# Patient Record
Sex: Male | Born: 1940 | Race: White | Hispanic: Yes
Health system: Northeastern US, Academic
[De-identification: ages and names within clinical notes are randomized; demographics above are authoritative.]

---

## 2016-02-18 ENCOUNTER — Ambulatory Visit: Admitting: Urology

## 2016-02-18 NOTE — Progress Notes (Signed)
* * *        **  [name]**    --- ---    85 Y old Male, DOB: 1941/03/03    [address] [place], [place], [place] [address]    Home: [phone]    Provider: Eliezer Mccoy        * * *    Telephone Encounter    ---    Answered by   Virgil Benedict  Date: 02/18/2016         Time: 11:05 AM    Caller   Romilda Joy    --- ---            Reason   IOV - elevated psa - [facility] [place] pt - await notes and labs            Message                      elevated PSA - rising- Hx pf prostate cx - medical records will be sent to office - looking for soon appt.            [phone]                Action Taken   Codorniz,Vanessa 02/18/2016 11:05:57 AM > Leahy,Nancy 02/18/2016  12:16:02 PM > [facility] [place] pt from [facility] - await notes Leahy,Nancy 02/18/2016 3:53:50  PM > no notes yet Leahy,Nancy 02/19/2016 11:37:42 AM > please call with appt  and let them know no notes have come over - last [facility] note was from 2011 - need  everything faxed - notes, labs, biopsy reports and xrays Ward,Brenda 02/19/2016  1:29:32 PM > Spoke with Kenney Houseman at Dr. Earnestine Mealing office, they will fax over  all notes, labs, etc.                * * *                ---          * * *          [name], Carlos Moody DOB: [birth year] Provider: Eliezer Mccoy  02/18/2016    ---    Note generated by eClinicalWorks EMR/PM Software ([email or web address])

## 2016-02-20 ENCOUNTER — Ambulatory Visit

## 2016-02-26 ENCOUNTER — Ambulatory Visit: Admitting: Urology

## 2016-02-26 ENCOUNTER — Ambulatory Visit (HOSPITAL_BASED_OUTPATIENT_CLINIC_OR_DEPARTMENT_OTHER)

## 2016-02-26 ENCOUNTER — Ambulatory Visit

## 2016-02-26 NOTE — Progress Notes (Signed)
* * *        **Carlos Moody**    --- ---    51 Y old Male, DOB: 01/26/1941    Account Number: [id number]    [address], [place], [id number]    Home: [phone]    Guarantor: Carlos Moody Insurance: Medicare Payer ID: Evans Lance    PCP: Ricky Ala, MD Referring: Ricky Ala, MD    Appointment Facility: [facility]        * * *    02/26/2016  Progress Notes: Artist Pais. Adolfo Granieri, M.D.    --- ---    ---        Current Medications    ---    Taking     * Losartan Potassium 100 MG Tablet 1 tablet Orally Once a day    ---    * Chlorthalidone 25 MG Tablet 1 tablet in the morning Orally Once a day    ---    * Amlodipine Besylate 5 MG Tablet 1 tablet Orally Once a day    ---    * Simvastatin 10 MG Tablet 1 tablet in the evening Orally Once a day    ---    * Aspirin 81 MG Tablet Chewable 1 tablet Orally Once a day    ---    * Fish Oil 1000 MG Capsule 1 capsule Orally Once a day    ---    * Centrum - Tablet Orally     ---    * Vitamin B12 1000 MCG Tablet Extended Release 1 tablet Orally Once a day    ---    * Cinnamon 500 MG Capsule Orally     ---    * Medication List reviewed and reconciled with the patient    ---      Past Medical History    ---       HTN (hypertension).        ---    Hypercholesteremia.        ---      Surgical History    ---      RALP    ---    Hernia    ---      Family History    ---      Father: deceased    ---    Mother: deceased    ---      Social History    ---    Alcohol: yes.    no Smoking status: former smoker.    no Recreational drug use.    Caffeine: yes, frequency:, tea, 1-2 cups/day.      Allergies    ---      N.K.D.A.    ---      Hospitalization/Major Diagnostic Procedure    ---      No Hospitalization History.    ---      Review of Systems    ---     _General_ :    no Fatigue. no Fever. no Weakness.    _ENT_ :    no Epistaxis. no Ringing in ears. no Sinus pain.    _Endocrine_ :    no Excessive thirst. no dry mouth.    _Cardiovascular_ :    no Chest  pain. no Palpitations.    _Pulmonary_ :    no cough. no shortness of breath. no Lung disease (COPD), Asthma.    _Gastroenterology_ :    no Blood in stool. no Constipation. no Diarrhea.  _HematologyLymph_ :    no Bleeding disorders. no Easy bruising. no Swollen glands.    _Male Reproductive_ :    Sexually active yes. no Difficulty with erection. Diminished sexual drive yes.    _Musculoskeletal_ :    Back pain yes. no Joint pain. no bone pain. no Spinal or disk problems.    _Integumentary_ :    no Persistent itching. no Skin rash.    _Urology_ :    no Blood in urine. no Frequent urination. no Urinary incontinence.    _Neurology_ :    no Headache. no Insomnia. no Memory loss.    _Psychology_ :    no Anxiety. no Depression. no High stress level.            Reason for Appointment    ---      1\. Elevated PSA    ---      History of Present Illness    ---     _Prostate Cancer_ :    Carlos Moody is here due to his history of prostate cancer. He had RALP in 2010, and  his psa was zero, but recently his psa rose to 0.07 and then 0.1. He does  follow up for this surveillance at [facility] and [facility].    Sees Dr. Lenny Pastel for psa 0.07 then 0.1. He does void well, notes some urgency,  and a hot feeling if he has to void. He has nocturia once. Some small volume  leak with sneeze. He is already scheduled for psa follow up at [facility] in  November.      Vital Signs    ---    Ht 65, Wt 183.0, BMI 30.45, BP 130/78, Temp 97.7, RR 16.      Physical Examination    ---     _Abdomen_ :    Guarding: no. Hernia: absent. Inguinal nodes: not enlarged. Liver, Spleen: not  palpable. Masses: no. Scars: intact, well healed, no hernia. Shape: normal.  Tenderness: no.          Assessments    ---    1\. Prostate cancer - C61 (Primary)    ---      Treatment    ---       **1\. Prostate cancer**    Notes: Carlos Moody is followed at [facility] for surveillance after prostate cancer  surgery in 2010. His psa was undetectable but is now 0.07 and then 0.1. He is  due for his next  psa in November and already has follow up with Dr. Lenny Pastel. I  agree that this bears watching, and if the psa rises again at his next  interval (above 0.2) then he may warrant repeat imaging and potential consider  treatment such as salvage radiation or androgen deprivation, but his current  psa levels are very low, and a period of continued surveillance is certainly  warranted.    ---        **2\. Others**    Notes: Patient Educated with: [email or web address] ([email or web address]).        Labs    --- ---    Gerald Stabs: ClinitekStatus SG_    ---       Color  yellow     \-    --- --- --- ---    GLU  Negative     \-    --- --- --- ---    BIL  Negative     \-    --- --- --- ---    KET  Negative     \-    --- --- --- ---  SG  1.020     \-    --- --- --- ---    BLO  Trace-intact     \-    --- --- --- ---    pH  8.5     \-    --- --- --- ---    PRO  Negative     \-    --- --- --- ---    URO  0.2 E.U./dL     \-    --- --- --- ---    NIT  Negative     \-    --- --- --- ---    LEU  Negative     \-    --- --- --- ---         Silva,Melinda 02/26/2016 1:17:14 PM >    --- ---    Gerald Stabs: Urine Microscopy_       WBC  0       --- --- --- ---    RBC  0       --- --- --- ---    Epithelial  1-2       --- --- --- ---    Bacteria  0       --- --- --- ---    Amorphous  0       --- --- --- ---    Mucous  0       --- --- --- ---    Casts  0       --- --- --- ---    Other  0       --- --- --- ---         Silva,Melinda 02/26/2016 4:28:24 PM >    --- ---      Procedure Codes    ---      14782 UA Automated    ---    95621 MICROSCOPIC EXAM OF URINE    ---      Follow Up    ---    prn    Electronically signed by Lawerance Cruel , MD on 03/01/2016 at 07:46 AM EDT    Sign off status: Completed        * * [facility]    [address]    [place], [place] [id number]    Tel: [phone]    Fax: [phone]              * * *          [name], Carlos Moody DOB: 17-Oct-1940 Progress Note: Artist Pais. Noel Gerold,  M.D. 02/26/2016    ---    Note  generated by eClinicalWorks EMR/PM Software ([email or web address])

## 2016-07-03 LAB — BMP (EXT)
Anion Gap (EXT): 11.3 mmol/L (ref 6.0–18.0)
Anion Gap (EXT): 10.7 mmol/L (ref 6.0–18.0)
Anion Gap (EXT): 10.3 mmol/L (ref 6.0–18.0)
Anion Gap (EXT): 9.1 mmol/L (ref 6.0–18.0)
Anion Gap (EXT): 9.5 mmol/L (ref 6.0–18.0)
Anion Gap (EXT): 10.4 mmol/L (ref 6.0–18.0)
BUN (EXT): 12 mg/dL (ref 6–25)
BUN (EXT): 11 mg/dL (ref 6–25)
BUN (EXT): 10 mg/dL (ref 6–25)
BUN (EXT): 10 mg/dL (ref 6–25)
BUN (EXT): 10 mg/dL (ref 6–25)
BUN (EXT): 9 mg/dL (ref 6–25)
CO2 (EXT): 26 mmol/L (ref 21–32)
CO2 (EXT): 24 mmol/L (ref 21–32)
CO2 (EXT): 26 mmol/L (ref 21–32)
CO2 (EXT): 24 mmol/L (ref 21–32)
CO2 (EXT): 25 mmol/L (ref 21–32)
CO2 (EXT): 24 mmol/L (ref 21–32)
CalciumCalcium (EXT): 9.2 mg/dL (ref 8.0–10.5)
CalciumCalcium (EXT): 8.5 mg/dL (ref 8.0–10.5)
CalciumCalcium (EXT): 8.8 mg/dL (ref 8.0–10.5)
CalciumCalcium (EXT): 8.5 mg/dL (ref 8.0–10.5)
CalciumCalcium (EXT): 8.7 mg/dL (ref 8.0–10.5)
CalciumCalcium (EXT): 8.7 mg/dL (ref 8.0–10.5)
Chloride (EXT): 82 mmol/L — ABNORMAL LOW (ref 99–111)
Chloride (EXT): 85 mmol/L — ABNORMAL LOW (ref 99–111)
Chloride (EXT): 84 mmol/L — ABNORMAL LOW (ref 99–111)
Chloride (EXT): 85 mmol/L — ABNORMAL LOW (ref 99–111)
Chloride (EXT): 85 mmol/L — ABNORMAL LOW (ref 99–111)
Chloride (EXT): 85 mmol/L — ABNORMAL LOW (ref 99–111)
Creatinine (EXT): 0.7 mg/dL (ref 0.5–1.5)
Creatinine (EXT): 0.7 mg/dL (ref 0.5–1.5)
Creatinine (EXT): 0.7 mg/dL (ref 0.5–1.5)
Creatinine (EXT): 0.6 mg/dL (ref 0.5–1.5)
Creatinine (EXT): 0.6 mg/dL (ref 0.5–1.5)
Creatinine (EXT): 0.6 mg/dL (ref 0.5–1.5)
Glucose (EXT): 121 mg/dL — ABNORMAL HIGH (ref 70–105)
Glucose (EXT): 158 mg/dL — ABNORMAL HIGH (ref 70–105)
Glucose (EXT): 130 mg/dL — ABNORMAL HIGH (ref 70–105)
Glucose (EXT): 158 mg/dL — ABNORMAL HIGH (ref 70–105)
Glucose (EXT): 139 mg/dL — ABNORMAL HIGH (ref 70–105)
Glucose (EXT): 114 mg/dL — ABNORMAL HIGH (ref 70–105)
Potassium (EXT): 3.3 mmol/L (ref 3.3–5.1)
Potassium (EXT): 2.7 mmol/L — CL (ref 3.3–5.1)
Potassium (EXT): 3.3 mmol/L (ref 3.3–5.1)
Potassium (EXT): 3.1 mmol/L — ABNORMAL LOW (ref 3.3–5.1)
Potassium (EXT): 3.5 mmol/L (ref 3.3–5.1)
Potassium (EXT): 3.4 mmol/L (ref 3.3–5.1)
Sodium (EXT): 116 mmol/L — CL (ref 133–145)
Sodium (EXT): 117 mmol/L — CL (ref 133–145)
Sodium (EXT): 117 mmol/L — CL (ref 133–145)
Sodium (EXT): 115 mmol/L — CL (ref 133–145)
Sodium (EXT): 116 mmol/L — CL (ref 133–145)
Sodium (EXT): 116 mmol/L — CL (ref 133–145)
eGFR - Creat MDRD (EXT): >60 mL/min (ref >=60)
eGFR - Creat MDRD (EXT): >60 mL/min (ref >=60)
eGFR - Creat MDRD (EXT): >60 mL/min (ref >=60)
eGFR - Creat MDRD (EXT): >60 mL/min (ref >=60)
eGFR - Creat MDRD (EXT): >60 mL/min (ref >=60)
eGFR - Creat MDRD (EXT): >60 mL/min (ref >=60)
eGFR - Creat MDRD (EXT): >60 mL/min (ref >=60)
eGFR - Creat MDRD (EXT): >60 mL/min (ref >=60)
eGFR - Creat MDRD (EXT): >60 mL/min (ref >=60)
eGFR - Creat MDRD (EXT): >60 mL/min (ref >=60)
eGFR - Creat MDRD (EXT): >60 mL/min (ref >=60)
eGFR - Creat MDRD (EXT): >60 mL/min (ref >=60)

## 2016-07-04 LAB — BMP (EXT)
Anion Gap (EXT): 8.1 mmol/L (ref 6.0–18.0)
Anion Gap (EXT): 9.9 mmol/L (ref 6.0–18.0)
BUN (EXT): 8 mg/dL (ref 6–25)
BUN (EXT): 11 mg/dL (ref 6–25)
CO2 (EXT): 27 mmol/L (ref 21–32)
CO2 (EXT): 25 mmol/L (ref 21–32)
CalciumCalcium (EXT): 8.6 mg/dL (ref 8.0–10.5)
CalciumCalcium (EXT): 8.8 mg/dL (ref 8.0–10.5)
Chloride (EXT): 91 mmol/L — ABNORMAL LOW (ref 99–111)
Chloride (EXT): 89 mmol/L — ABNORMAL LOW (ref 99–111)
Creatinine (EXT): 0.6 mg/dL (ref 0.5–1.5)
Creatinine (EXT): 0.6 mg/dL (ref 0.5–1.5)
Glucose (EXT): 105 mg/dL (ref 70–105)
Glucose (EXT): 138 mg/dL — ABNORMAL HIGH (ref 70–105)
Potassium (EXT): 4.1 mmol/L (ref 3.3–5.1)
Potassium (EXT): 3.9 mmol/L (ref 3.3–5.1)
Sodium (EXT): 122 mmol/L — ABNORMAL LOW (ref 133–145)
Sodium (EXT): 120 mmol/L — ABNORMAL LOW (ref 133–145)
eGFR - Creat MDRD (EXT): >60 mL/min (ref >=60)
eGFR - Creat MDRD (EXT): >60 mL/min (ref >=60)
eGFR - Creat MDRD (EXT): >60 mL/min (ref >=60)
eGFR - Creat MDRD (EXT): >60 mL/min (ref >=60)

## 2016-07-04 LAB — UNMAPPED LAB RESULTS
Potassium (EXT): 3.5 mmol/L (ref 3.3–5.1)
Sodium (EXT): 115 mmol/L — CL (ref 133–145)
Sodium (EXT): 120 mmol/L — ABNORMAL LOW (ref 135–145)
Sodium (EXT): 123 mmol/L — ABNORMAL LOW (ref 133–145)
Sodium (EXT): 120 mmol/L — ABNORMAL LOW (ref 133–145)
Sodium (EXT): 120 mmol/L — ABNORMAL LOW (ref 133–145)
Sodium (EXT): 121 mmol/L — ABNORMAL LOW (ref 133–145)

## 2016-07-05 LAB — BMP (EXT)
Anion Gap (EXT): 9.7 mmol/L (ref 6.0–18.0)
Anion Gap (EXT): 13.8 mmol/L (ref 6.0–18.0)
BUN (EXT): 11 mg/dL (ref 6–25)
BUN (EXT): 12 mg/dL (ref 6–25)
CO2 (EXT): 27 mmol/L (ref 21–32)
CO2 (EXT): 26 mmol/L (ref 21–32)
CalciumCalcium (EXT): 8.6 mg/dL (ref 8.0–10.5)
CalciumCalcium (EXT): 9 mg/dL (ref 8.0–10.5)
Chloride (EXT): 88 mmol/L — ABNORMAL LOW (ref 99–111)
Chloride (EXT): 85 mmol/L — ABNORMAL LOW (ref 99–111)
Creatinine (EXT): 0.6 mg/dL (ref 0.5–1.5)
Creatinine (EXT): 0.7 mg/dL (ref 0.5–1.5)
Glucose (EXT): 112 mg/dL — ABNORMAL HIGH (ref 70–105)
Glucose (EXT): 125 mg/dL — ABNORMAL HIGH (ref 70–105)
Potassium (EXT): 3.7 mmol/L (ref 3.3–5.1)
Potassium (EXT): 3.8 mmol/L (ref 3.3–5.1)
Sodium (EXT): 121 mmol/L — ABNORMAL LOW (ref 133–145)
Sodium (EXT): 121 mmol/L — ABNORMAL LOW (ref 133–145)
eGFR - Creat MDRD (EXT): >60 mL/min (ref >=60)
eGFR - Creat MDRD (EXT): >60 mL/min (ref >=60)
eGFR - Creat MDRD (EXT): >60 mL/min (ref >=60)
eGFR - Creat MDRD (EXT): >60 mL/min (ref >=60)

## 2016-07-05 LAB — PSA (EXT): PSA (EXT): <0.09 ng/mL (ref 0.00–4.00)

## 2016-07-05 LAB — UNMAPPED LAB RESULTS: Sodium (EXT): 122 mmol/L — ABNORMAL LOW (ref 133–145)

## 2016-07-06 LAB — BMP (EXT)
Anion Gap (EXT): 12 mmol/L (ref 6.0–18.0)
Anion Gap (EXT): 11.7 mmol/L (ref 6.0–18.0)
BUN (EXT): 14 mg/dL (ref 6–25)
BUN (EXT): 11 mg/dL (ref 6–25)
CO2 (EXT): 27 mmol/L (ref 21–32)
CO2 (EXT): 26 mmol/L (ref 21–32)
CalciumCalcium (EXT): 8.9 mg/dL (ref 8.0–10.5)
CalciumCalcium (EXT): 8.9 mg/dL (ref 8.0–10.5)
Chloride (EXT): 87 mmol/L — ABNORMAL LOW (ref 99–111)
Chloride (EXT): 90 mmol/L — ABNORMAL LOW (ref 99–111)
Creatinine (EXT): 0.7 mg/dL (ref 0.5–1.5)
Creatinine (EXT): 0.7 mg/dL (ref 0.5–1.5)
Glucose (EXT): 111 mg/dL — ABNORMAL HIGH (ref 70–105)
Glucose (EXT): 111 mg/dL — ABNORMAL HIGH (ref 70–105)
Potassium (EXT): 4 mmol/L (ref 3.3–5.1)
Potassium (EXT): 3.7 mmol/L (ref 3.3–5.1)
Sodium (EXT): 122 mmol/L — ABNORMAL LOW (ref 133–145)
Sodium (EXT): 124 mmol/L — ABNORMAL LOW (ref 133–145)
eGFR - Creat MDRD (EXT): >60 mL/min (ref >=60)
eGFR - Creat MDRD (EXT): >60 mL/min (ref >=60)
eGFR - Creat MDRD (EXT): >60 mL/min (ref >=60)
eGFR - Creat MDRD (EXT): >60 mL/min (ref >=60)

## 2017-02-17 LAB — BMP (EXT)
BUN (EXT): 25 mg/dL (ref 7–30)
CO2 (EXT): 25 mmol/L (ref 21–33)
CalciumCalcium (EXT): 9.3 mg/dL (ref 8.8–10.6)
Chloride (EXT): 106 mmol/L (ref 98–110)
Creatinine (EXT): 0.86 mg/dL (ref 0.50–1.40)
Glucose (EXT): 100 mg/dL — ABNORMAL HIGH (ref 60–99)
Potassium (EXT): 3.8 mmol/L (ref 3.3–5.3)
Sodium (EXT): 141 mmol/L (ref 134–146)
eGFR - Creat MDRD (EXT): >60 (ref >60)
eGFR - Creat MDRD (EXT): >60 (ref >60)

## 2017-02-17 LAB — LIPID PROFILE (EXT)
Chol/HDL Ratio (EXT): 3.3 (ref <=4.9)
Cholesterol (EXT): 166 mg/dL (ref <=199)
HDL Cholesterol (EXT): 50 mg/dL (ref >=41)
LDL Cholesterol, CALC (EXT): 86.6 mg/dL (ref <=130)
Triglycerides (EXT): 147 mg/dL (ref <=149)

## 2017-02-17 LAB — PSA (EXT): PSA (EXT): 0.1 ng/mL (ref 0.0–4.0)

## 2018-03-01 LAB — BMP (EXT)
BUN (EXT): 14 mg/dL (ref 7–30)
CO2 (EXT): 27 mmol/L (ref 21–33)
CalciumCalcium (EXT): 8.9 mg/dL (ref 8.8–10.6)
Chloride (EXT): 101 mmol/L (ref 98–110)
Creatinine (EXT): 0.83 mg/dL (ref 0.50–1.40)
Glucose (EXT): 105 mg/dL — ABNORMAL HIGH (ref 60–99)
Potassium (EXT): 3.8 mmol/L (ref 3.3–5.3)
Sodium (EXT): 135 mmol/L (ref 134–146)
eGFR - Creat MDRD (EXT): >60 (ref >60)
eGFR - Creat MDRD (EXT): >60 (ref >60)

## 2018-03-01 LAB — LIPID PROFILE (EXT)
Chol/HDL Ratio (EXT): 3.3 (ref <=4.9)
Cholesterol (EXT): 180 mg/dL (ref <=199)
HDL Cholesterol (EXT): 55 mg/dL (ref >=41)
LDL Cholesterol, CALC (EXT): 100.8 mg/dL (ref <=130)
Triglycerides (EXT): 121 mg/dL (ref <=149)

## 2018-03-01 LAB — PSA (EXT): PSA (EXT): 0.1 ng/mL (ref 0.0–4.0)

## 2019-03-20 LAB — CREATININE W/ EGFR (EXT)
Creatinine (EXT): 0.8 mg/dL (ref 0.50–1.40)
eGFR - Creat MDRD (EXT): >60 (ref >60)
eGFR - Creat MDRD (EXT): >60 (ref >60)

## 2019-03-20 LAB — UNMAPPED LAB RESULTS: Potassium (EXT): 4 mmol/L (ref 3.3–5.3)

## 2019-03-22 LAB — LIPID PROFILE (EXT)
Chol/HDL Ratio (EXT): 3 (ref <=4.9)
Cholesterol (EXT): 209 mg/dL — ABNORMAL HIGH (ref <=199)
HDL Cholesterol (EXT): 70 mg/dL (ref >=41)
LDL Cholesterol, CALC (EXT): 104.4 mg/dL (ref <=130)
Triglycerides (EXT): 173 mg/dL — ABNORMAL HIGH (ref <=149)

## 2019-03-22 LAB — PSA (EXT): PSA (EXT): 0.1 ng/mL (ref 0.0–4.0)

## 2020-03-07 LAB — CREATININE W/ EGFR (EXT)
Creatinine (EXT): 0.88 mg/dL (ref 0.50–1.40)
eGFR - Creat MDRD (EXT): >60 (ref >60)
eGFR - Creat MDRD (EXT): >60 (ref >60)

## 2020-03-07 LAB — LIPID PROFILE (EXT)
Chol/HDL Ratio (EXT): 3.2 (ref <=4.9)
Cholesterol (EXT): 206 mg/dL — ABNORMAL HIGH (ref <=199)
HDL Cholesterol (EXT): 65 mg/dL (ref >=41)
LDL Cholesterol, CALC (EXT): 113.2 mg/dL (ref <=130)
Triglycerides (EXT): 139 mg/dL (ref <=149)

## 2020-03-07 LAB — HEPATITIS C ANTIBODY (EXT): HEPATITIS C ANTIBODY (EXT): <0.02 {index_val} (ref <0.80)

## 2020-03-07 LAB — UNMAPPED LAB RESULTS: Potassium (EXT): 4.2 mmol/L (ref 3.3–5.3)

## 2020-03-07 LAB — PSA (EXT): PSA (EXT): 0.1 ng/mL (ref 0.0–4.0)

## 2020-03-11 LAB — UNMAPPED LAB RESULTS: Sodium (EXT): 132 mmol/L — ABNORMAL LOW (ref 134–146)

## 2020-03-13 LAB — UNMAPPED LAB RESULTS: Sodium (EXT): 132 mmol/L — ABNORMAL LOW (ref 134–146)

## 2020-03-17 LAB — UNMAPPED LAB RESULTS: Sodium (EXT): 131 mmol/L — ABNORMAL LOW (ref 134–146)

## 2020-03-27 LAB — BMP (EXT)
BUN (EXT): 19 mg/dL (ref 7–30)
CO2 (EXT): 26 mmol/L (ref 21–33)
CalciumCalcium (EXT): 9.1 mg/dL (ref 8.8–10.6)
Chloride (EXT): 102 mmol/L (ref 98–110)
Creatinine (EXT): 0.88 mg/dL (ref 0.50–1.40)
Glucose (EXT): 94 mg/dL (ref 65–139)
Potassium (EXT): 4 mmol/L (ref 3.3–5.3)
Sodium (EXT): 135 mmol/L (ref 134–146)
eGFR - Creat MDRD (EXT): >60 (ref >60)
eGFR - Creat MDRD (EXT): >60 (ref >60)

## 2020-04-17 LAB — PSA SCREENING (EXT): PSA (EXT): 0.09 ng/mL (ref 0.00–4.00)

## 2020-09-16 NOTE — Progress Notes (Signed)
* * *        **  [name]**    --- ---    85 Y old Male, DOB: 1941/03/03    [address] [place], [place], [place] [address]    Home: [phone]    Provider: Eliezer Mccoy        * * *    Telephone Encounter    ---    Answered by   Virgil Benedict  Date: 02/18/2016         Time: 11:05 AM    Caller   Romilda Joy    --- ---            Reason   IOV - elevated psa - [facility] [place] pt - await notes and labs            Message                      elevated PSA - rising- Hx pf prostate cx - medical records will be sent to office - looking for soon appt.            [phone]                Action Taken   Codorniz,Vanessa 02/18/2016 11:05:57 AM > Leahy,Nancy 02/18/2016  12:16:02 PM > [facility] [place] pt from [facility] - await notes Leahy,Nancy 02/18/2016 3:53:50  PM > no notes yet Leahy,Nancy 02/19/2016 11:37:42 AM > please call with appt  and let them know no notes have come over - last [facility] note was from 2011 - need  everything faxed - notes, labs, biopsy reports and xrays Ward,Brenda 02/19/2016  1:29:32 PM > Spoke with Kenney Houseman at Dr. Earnestine Mealing office, they will fax over  all notes, labs, etc.                * * *                ---          * * *          [name], Carlos Moody DOB: [birth year] Provider: Eliezer Mccoy  02/18/2016    ---    Note generated by eClinicalWorks EMR/PM Software ([email or web address])

## 2020-09-16 NOTE — Progress Notes (Signed)
* * *        **Carlos Moody**    --- ---    51 Y old Male, DOB: 01/26/1941    Account Number: [id number]    [address], [place], [id number]    Home: [phone]    Guarantor: Carlos Moody Insurance: Medicare Payer ID: Evans Lance    PCP: Ricky Ala, MD Referring: Ricky Ala, MD    Appointment Facility: [facility]        * * *    02/26/2016  Progress Notes: Artist Pais. Adolfo Granieri, M.D.    --- ---    ---        Current Medications    ---    Taking     * Losartan Potassium 100 MG Tablet 1 tablet Orally Once a day    ---    * Chlorthalidone 25 MG Tablet 1 tablet in the morning Orally Once a day    ---    * Amlodipine Besylate 5 MG Tablet 1 tablet Orally Once a day    ---    * Simvastatin 10 MG Tablet 1 tablet in the evening Orally Once a day    ---    * Aspirin 81 MG Tablet Chewable 1 tablet Orally Once a day    ---    * Fish Oil 1000 MG Capsule 1 capsule Orally Once a day    ---    * Centrum - Tablet Orally     ---    * Vitamin B12 1000 MCG Tablet Extended Release 1 tablet Orally Once a day    ---    * Cinnamon 500 MG Capsule Orally     ---    * Medication List reviewed and reconciled with the patient    ---      Past Medical History    ---       HTN (hypertension).        ---    Hypercholesteremia.        ---      Surgical History    ---      RALP    ---    Hernia    ---      Family History    ---      Father: deceased    ---    Mother: deceased    ---      Social History    ---    Alcohol: yes.    no Smoking status: former smoker.    no Recreational drug use.    Caffeine: yes, frequency:, tea, 1-2 cups/day.      Allergies    ---      N.K.D.A.    ---      Hospitalization/Major Diagnostic Procedure    ---      No Hospitalization History.    ---      Review of Systems    ---     _General_ :    no Fatigue. no Fever. no Weakness.    _ENT_ :    no Epistaxis. no Ringing in ears. no Sinus pain.    _Endocrine_ :    no Excessive thirst. no dry mouth.    _Cardiovascular_ :    no Chest  pain. no Palpitations.    _Pulmonary_ :    no cough. no shortness of breath. no Lung disease (COPD), Asthma.    _Gastroenterology_ :    no Blood in stool. no Constipation. no Diarrhea.  _HematologyLymph_ :    no Bleeding disorders. no Easy bruising. no Swollen glands.    _Male Reproductive_ :    Sexually active yes. no Difficulty with erection. Diminished sexual drive yes.    _Musculoskeletal_ :    Back pain yes. no Joint pain. no bone pain. no Spinal or disk problems.    _Integumentary_ :    no Persistent itching. no Skin rash.    _Urology_ :    no Blood in urine. no Frequent urination. no Urinary incontinence.    _Neurology_ :    no Headache. no Insomnia. no Memory loss.    _Psychology_ :    no Anxiety. no Depression. no High stress level.            Reason for Appointment    ---      1\. Elevated PSA    ---      History of Present Illness    ---     _Prostate Cancer_ :    Carlos Moody is here due to his history of prostate cancer. He had RALP in 2010, and  his psa was zero, but recently his psa rose to 0.07 and then 0.1. He does  follow up for this surveillance at [facility] and [facility].    Sees Dr. Lenny Pastel for psa 0.07 then 0.1. He does void well, notes some urgency,  and a hot feeling if he has to void. He has nocturia once. Some small volume  leak with sneeze. He is already scheduled for psa follow up at [facility] in  November.      Vital Signs    ---    Ht 65, Wt 183.0, BMI 30.45, BP 130/78, Temp 97.7, RR 16.      Physical Examination    ---     _Abdomen_ :    Guarding: no. Hernia: absent. Inguinal nodes: not enlarged. Liver, Spleen: not  palpable. Masses: no. Scars: intact, well healed, no hernia. Shape: normal.  Tenderness: no.          Assessments    ---    1\. Prostate cancer - C61 (Primary)    ---      Treatment    ---       **1\. Prostate cancer**    Notes: Carlos Moody is followed at [facility] for surveillance after prostate cancer  surgery in 2010. His psa was undetectable but is now 0.07 and then 0.1. He is  due for his next  psa in November and already has follow up with Dr. Lenny Pastel. I  agree that this bears watching, and if the psa rises again at his next  interval (above 0.2) then he may warrant repeat imaging and potential consider  treatment such as salvage radiation or androgen deprivation, but his current  psa levels are very low, and a period of continued surveillance is certainly  warranted.    ---        **2\. Others**    Notes: Patient Educated with: [email or web address] ([email or web address]).        Labs    --- ---    Gerald Stabs: ClinitekStatus SG_    ---       Color  yellow     \-    --- --- --- ---    GLU  Negative     \-    --- --- --- ---    BIL  Negative     \-    --- --- --- ---    KET  Negative     \-    --- --- --- ---  SG  1.020     \-    --- --- --- ---    BLO  Trace-intact     \-    --- --- --- ---    pH  8.5     \-    --- --- --- ---    PRO  Negative     \-    --- --- --- ---    URO  0.2 E.U./dL     \-    --- --- --- ---    NIT  Negative     \-    --- --- --- ---    LEU  Negative     \-    --- --- --- ---         Silva,Melinda 02/26/2016 1:17:14 PM >    --- ---    Gerald Stabs: Urine Microscopy_       WBC  0       --- --- --- ---    RBC  0       --- --- --- ---    Epithelial  1-2       --- --- --- ---    Bacteria  0       --- --- --- ---    Amorphous  0       --- --- --- ---    Mucous  0       --- --- --- ---    Casts  0       --- --- --- ---    Other  0       --- --- --- ---         Silva,Melinda 02/26/2016 4:28:24 PM >    --- ---      Procedure Codes    ---      14782 UA Automated    ---    95621 MICROSCOPIC EXAM OF URINE    ---      Follow Up    ---    prn    Electronically signed by Lawerance Cruel , MD on 03/01/2016 at 07:46 AM EDT    Sign off status: Completed        * * [facility]    [address]    [place], [place] [id number]    Tel: [phone]    Fax: [phone]              * * *          [name], Carlos Moody DOB: 17-Oct-1940 Progress Note: Artist Pais. Noel Gerold,  M.D. 02/26/2016    ---    Note  generated by eClinicalWorks EMR/PM Software ([email or web address])

## 2020-12-24 ENCOUNTER — Other Ambulatory Visit: Admit: 2020-12-25 | Payer: MEDICARE | Primary: Internal Medicine

## 2020-12-24 ENCOUNTER — Other Ambulatory Visit: Admit: 2020-12-24 | Payer: MEDICARE | Primary: Internal Medicine

## 2020-12-24 ENCOUNTER — Encounter

## 2020-12-24 ENCOUNTER — Other Ambulatory Visit

## 2020-12-24 DIAGNOSIS — E871 Hypo-osmolality and hyponatremia: Secondary | ICD-10-CM

## 2020-12-24 LAB — BASIC METABOLIC PANEL
Anion Gap: 5 mmol/L (ref 3–14)
BUN: 23 mg/dL (ref 6–24)
CO2 (Bicarbonate): 28 mmol/L (ref 20–32)
Calcium: 8.6 mg/dL (ref 8.5–10.5)
Chloride: 105 mmol/L (ref 98–110)
Creatinine: 0.72 mg/dL (ref 0.55–1.30)
Glucose: 98 mg/dL (ref 70–110)
Potassium: 3.9 mmol/L (ref 3.6–5.2)
Sodium: 138 mmol/L (ref 135–146)
eGFRcr: 94 mL/min/{1.73_m2} (ref >=60)

## 2020-12-24 LAB — SODIUM, URINE, RANDOM: Sodium, urine: 48 mmol/L

## 2020-12-24 LAB — TSH: TSH: 2.32 u[IU]/mL (ref 0.358–3.740)

## 2020-12-24 LAB — OSMOLALITY, URINE: Osmolality, urine: 856 mosm/kg (ref 50–1400)

## 2021-04-01 LAB — LIPID PROFILE (EXT)
Chol/HDL Ratio (EXT): 2.9 (ref <=4.9)
Cholesterol (EXT): 154 mg/dL (ref <=199)
HDL Cholesterol (EXT): 53 mg/dL (ref >=41)
LDL Cholesterol, CALC (EXT): 84 mg/dL (ref <=130)
Triglycerides (EXT): 85 mg/dL (ref <=149)

## 2021-04-01 LAB — UNMAPPED LAB RESULTS: Potassium (EXT): 4.3 mmol/L (ref 3.3–5.3)

## 2021-04-01 LAB — CREATININE W/ EGFR (EXT)
Creatinine (EXT): 0.91 mg/dL (ref 0.50–1.40)
eGFR - Creat CKD-EPI (EXT): >60 (ref >60)

## 2022-01-16 ENCOUNTER — Inpatient Hospital Stay
Admit: 2022-01-16 | Discharge: 2022-01-17 | Disposition: A | Discharge status: Home / Self Care | Payer: MEDICARE | Attending: Pediatrics

## 2022-01-16 ENCOUNTER — Emergency Department: Admit: 2022-01-16 | Payer: MEDICARE | Primary: Internal Medicine

## 2022-01-16 DIAGNOSIS — T7840XA Allergy, unspecified, initial encounter: Secondary | ICD-10-CM

## 2022-01-16 NOTE — Other (Signed)
Patient Education   Table of Contents     Allergies, Adult     Dysphagia     Hypertension, Adult     To view videos and all your education online visit,   [email or web address]   or scan this QR code with your smartphone.   Access to this content will expire in one year.                    Allergies, Adult     An allergy is a condition in which the body's defense system (immune system) comes in contact with an allergen and reacts to it. An allergen is anything that causes an allergic reaction. Allergens cause the immune system to make proteins for fighting infections (antibodies). These antibodies cause cells to release chemicals called histamines that set off the symptoms of an allergic reaction.   Allergies often affect the nasal passages (allergic rhinitis), eyes (allergic conjunctivitis), skin (atopic dermatitis), and stomach. Allergies can be mild, moderate, or severe. They cannot spread from person to person. Allergies can develop at any age and may be outgrown.   What are the causes?    This condition is caused by allergens. Common allergens include:     Outdoor allergens, such as pollen, car fumes, and mold.     Indoor allergens, such as dust, smoke, mold, and pet dander.     Other allergens, such as foods, medicines, scents, insect bites or stings, and other skin irritants.     What increases the risk?    You are more likely to develop this condition if you have:     Family members with allergies.     Family members who have any condition that may be caused by allergens, such as asthma. This may make you more likely to have other allergies.     What are the signs or symptoms?   Symptoms of this condition depend on the severity of the allergy.   Mild to moderate symptoms     Runny nose, stuffy nose (nasal congestion), or sneezing.     Itchy mouth, ears, or throat.     A feeling of mucus dripping down the back of your throat (postnasal drip).     Sore throat.     Itchy, red, watery, or puffy eyes.      Skin rash, or itchy, red, swollen areas of skin (hives).     Stomach cramps or bloating.     Severe symptoms  Severe allergies to food, medicine, or insect bites may cause anaphylaxis, which can be life-threatening. Symptoms include:     A red (flushed) face.     Wheezing or coughing.     Swollen lips, tongue, or mouth.     Tight or swollen throat.     Chest pain or tightness, or rapid heartbeat.     Trouble breathing or shortness of breath.     Pain in the abdomen, vomiting, or diarrhea.     Dizziness or fainting.     How is this diagnosed?    This condition is diagnosed based on your symptoms, your family and medical history, and a physical exam. You may also have tests, including:     Skin tests to see how your skin reacts to allergens that may be causing your symptoms. Tests include:     Skin prick test. For this test, an allergen is introduced to your body through a small opening in the skin.     Intradermal skin  test. For this test, a small amount of allergen is injected under the first layer of your skin.     Patch test. For this test, a small amount of allergen is placed on your skin. The area is covered and then checked after a few days.     Blood tests.     A challenge test. For this test, you will eat or breathe in a small amount of allergen to see if you have an allergic reaction.      You may also be asked to:     Keep a food diary. This is a record of all the foods, drinks, and symptoms you have in a day.     Try an elimination diet. To do this:     Remove certain foods from your diet.     Add those foods back one by one to find out if any foods cause an allergic reaction.     How is this treated?            Treatment for allergies depends on your symptoms. Treatment may include:     Cold, wet cloths (cold compresses) to soothe itching and swelling.     Eye drops or nasal sprays.     Nasal irrigation to help clear your mucus or keep the nasal passages moist.     A humidifier to add moisture to the  air.     Skin creams to treat rashes or itching.     Oral antihistamines or other medicines to block the reaction or to treat inflammation.     Diet changes to remove foods that cause allergies.     Being exposed again and again to tiny amounts of allergens to help you build a defense against it (tolerance). This is called immunotherapy. Examples include:     Allergy shot. You receive an injection that contains an allergen.     Sublingual immunotherapy. You take a small dose of allergen under your tongue.     Emergency injection for anaphylaxis. You give yourself a shot using a syringe (auto-injector) that contains the amount of medicine you need. Your health care provider will teach you how to give yourself an injection.       Follow these instructions at home:   Medicines        Take or apply over-the-counter and prescription medicines only as told by your health care provider.     Always carry your auto-injector pen if you are at risk of anaphylaxis. Give yourself an injection as told by your health care provider.     Eating and drinking     Follow instructions from your health care provider about eating or drinking restrictions.     Drink enough fluid to keep your urine pale yellow.     General instructions     Wear a medical alert bracelet or necklace to let others know that you have had anaphylaxis before.     Avoid known allergens whenever possible.     Keep all follow-up visits as told by your health care provider. This is important.       Contact a health care provider if:       Your symptoms do not get better with treatment.     Get help right away if:       You have symptoms of anaphylaxis. These include:     Swollen mouth, tongue, or throat.     Pain or tightness in your chest.  Trouble breathing or shortness of breath.     Dizziness or fainting.     Severe abdominal pain, vomiting, or diarrhea.     These symptoms may represent a serious problem that is an emergency. Do not wait to see if the symptoms  will go away. Get medical help right away. Call your local emergency services (911 in the [place] Do not drive yourself to the hospital.   Summary       Take or apply over-the-counter and prescription medicines only as told by your health care provider.     Avoid known allergens when possible.     Always carry your auto-injector pen if you are at risk of anaphylaxis. Give yourself an injection as told by your health care provider.     Wear a medical alert bracelet or necklace to let others know that you have had anaphylaxis before.     Anaphylaxis is a life-threatening emergency. Get help right away.     This information is not intended to replace advice given to you by your health care provider. Make sure you discuss any questions you have with your health care provider.     Document Released: 08/31/2002 Document Revised: 02/04/2020 Document Reviewed: 04/18/2019     Elsevier Patient Education ? 2023 [employer]         Dysphagia        Dysphagia is trouble swallowing. This condition occurs when solids and liquids stick in a person's throat on the way down to the stomach, or when food takes longer to get to the stomach than usual.   You may have problems swallowing food, liquids, or both. You may also have pain while trying to swallow. It may take you more time and effort to swallow something.     What are the causes?    This condition may be caused by:     Muscle problems. These may make it difficult for you to move food and liquids through the esophagus, which is the tube that connects your mouth to your stomach.     Blockages. You may have ulcers, scar tissue, or inflammation that blocks the normal passage of food and liquids. Causes of these problems include:     Acid reflux from your stomach into your esophagus (gastroesophageal reflux).     Infections.     Radiation treatment for cancer.     Medicines taken without enough fluids to wash them down into your stomach.     Stroke. This can affect the nerves and  make it difficult to swallow.     Nerve problems. These prevent signals from being sent to the muscles of your esophagus to squeeze (contract) and move what you swallow down to your stomach.     Globus pharyngeus. This is a common problem that involves a feeling like something is stuck in your throat or a sense of trouble with swallowing, even though nothing is wrong with the swallowing passages.     Certain conditions, such as cerebral palsy or Parkinson's disease.     What are the signs or symptoms?    Common symptoms of this condition include:     A feeling that solids or liquids are stuck in your throat on the way down to the stomach.     Pain while swallowing.     Coughing or gagging while trying to swallow.      Other symptoms include:     Food moving back from your stomach to your  mouth (regurgitation).     Noises coming from your throat.     Chest discomfort when swallowing.     A feeling of fullness when swallowing.     Drooling, especially when the throat is blocked.     Heartburn.     How is this diagnosed?    This condition may be diagnosed by:     Barium swallow X-ray. In this test, you will swallow a white liquid that sticks to the inside of your esophagus. X-ray images are then taken.     Endoscopy. In this test, a flexible telescope is inserted down your throat to look at your esophagus and your stomach.     CT scans or an MRI.     How is this treated?    Treatment for dysphagia depends on the cause of this condition:     If the dysphagia is caused by acid reflux or infection, medicines may be used. These may include antibiotics or heartburn medicines.     If the dysphagia is caused by problems with the muscles, swallowing therapy may be used to help you strengthen your swallowing muscles. You may have to do specific exercises to strengthen the muscles or stretch them.     If the dysphagia is caused by a blockage or mass, procedures to remove the blockage may be done. You may need surgery and a  feeding tube.     You may need to make diet changes. Ask your health care provider for specific instructions.   Follow these instructions at home:   Medicines     Take over-the-counter and prescription medicines only as told by your health care provider.     If you were prescribed an antibiotic medicine, take it as told by your health care provider. Do not  stop taking the antibiotic even if you start to feel better.     Eating and drinking        Make any diet changes as told by your health care provider.     Work with a diet and nutrition specialist (dietitian) to create an eating plan that will help you get the nutrients you need in order to stay healthy.     Eat soft foods that are easier to swallow.     Cut your food into small pieces and eat slowly. Take small bites.     Eat and drink only when you are sitting upright.    Do not  drink alcohol or caffeine. If you need help quitting, ask your health care provider.     General instructions     Check your weight every day to make sure you are not losing weight.    Do not  use any products that contain nicotine or tobacco. These products include cigarettes, chewing tobacco, and vaping devices, such as e-cigarettes. If you need help quitting, ask your health care provider.     Keep all follow-up visits. This is important.       Contact a health care provider if:       You lose weight because you cannot swallow.     You cough when you drink liquids.     You cough up partially digested food.     Get help right away if:       You cannot swallow your saliva.     You have shortness of breath, a fever, or both.     Your voice is hoarse and you have trouble swallowing.  These symptoms may represent a serious problem that is an emergency. Do not wait to see if the symptoms will go away. Get medical help right away. Call your local emergency services (911 in the [place] Do not drive yourself to the hospital.   Summary       Dysphagia is trouble swallowing. This condition  occurs when solids and liquids stick in a person's throat on the way down to the stomach. You may cough or gag while trying to swallow.     Dysphagia has many possible causes.     Treatment for dysphagia depends on the cause of the condition.     Keep all follow-up visits. This is important.     This information is not intended to replace advice given to you by your health care provider. Make sure you discuss any questions you have with your health care provider.     Document Released: 06/04/2000 Document Revised: 01/26/2020 Document Reviewed: 01/26/2020     Elsevier Patient Education ? 2023 [employer]         Hypertension, Adult     High blood pressure (hypertension) is when the force of blood pumping through the arteries is too strong. The arteries are the blood vessels that carry blood from the heart throughout the body. Hypertension forces the heart to work harder to pump blood and may cause arteries to become narrow or stiff. Untreated or uncontrolled hypertension can lead to a heart attack, heart failure, a stroke, kidney disease, and other problems.   A blood pressure reading consists of a higher number over a lower number. Ideally, your blood pressure should be below 120/80. The first ("top") number is called the systolic pressure. It is a measure of the pressure in your arteries as your heart beats. The second ("bottom") number is called the diastolic pressure. It is a measure of the pressure in your arteries as the heart relaxes.   What are the causes?   The exact cause of this condition is not known. There are some conditions that result in high blood pressure.   What increases the risk?    Certain factors may make you more likely to develop high blood pressure. Some of these risk factors are under your control, including:     Smoking.     Not getting enough exercise or physical activity.     Being overweight.     Having too much fat, sugar, calories, or salt (sodium) in your diet.     Drinking too  much alcohol.      Other risk factors include:     Having a personal history of heart disease, diabetes, high cholesterol, or kidney disease.     Stress.     Having a family history of high blood pressure and high cholesterol.     Having obstructive sleep apnea.     Age. The risk increases with age.     What are the signs or symptoms?    High blood pressure may not cause symptoms. Very high blood pressure (hypertensive crisis) may cause:     Headache.     Fast or irregular heartbeats (palpitations).     Shortness of breath.     Nosebleed.     Nausea and vomiting.     Vision changes.     Severe chest pain, dizziness, and seizures.     How is this diagnosed?   This condition is diagnosed by measuring your blood pressure while you are seated, with your  arm resting on a flat surface, your legs uncrossed, and your feet flat on the floor. The cuff of the blood pressure monitor will be placed directly against the skin of your upper arm at the level of your heart. Blood pressure should be measured at least twice using the same arm. Certain conditions can cause a difference in blood pressure between your right and left arms.    If you have a high blood pressure reading during one visit or you have normal blood pressure with other risk factors, you may be asked to:     Return on a different day to have your blood pressure checked again.     Monitor your blood pressure at home for 1 week or longer.     If you are diagnosed with hypertension, you may have other blood or imaging tests to help your health care provider understand your overall risk for other conditions.   How is this treated?   This condition is treated by making healthy lifestyle changes, such as eating healthy foods, exercising more, and reducing your alcohol intake. You may be referred for counseling on a healthy diet and physical activity.    Your health care provider may prescribe medicine if lifestyle changes are not enough to get your blood pressure under  control and if:     Your systolic blood pressure is above 130.     Your diastolic blood pressure is above 80.     Your personal target blood pressure may vary depending on your medical conditions, your age, and other factors.   Follow these instructions at home:   Eating and drinking        Eat a diet that is high in fiber and potassium, and low in sodium, added sugar, and fat. An example of this eating plan is called the DASH diet. DASH stands for Dietary Approaches to Stop Hypertension. To eat this way:     Eat plenty of fresh fruits and vegetables. Try to fill one half of your plate at each meal with fruits and vegetables.     Eat whole grains, such as whole-wheat pasta, brown rice, or whole-grain bread. Fill about one fourth of your plate with whole grains.     Eat or drink low-fat dairy products, such as skim milk or low-fat yogurt.     Avoid fatty cuts of meat, processed or cured meats, and poultry with skin. Fill about one fourth of your plate with lean proteins, such as fish, chicken without skin, beans, eggs, or tofu.     Avoid pre-made and processed foods. These tend to be higher in sodium, added sugar, and fat.     Reduce your daily sodium intake. Many people with hypertension should eat less than 1,500 mg of sodium a day.    Do not  drink alcohol if:     Your health care provider tells you not to drink.     You are pregnant, may be pregnant, or are planning to become pregnant.     If you drink alcohol:     Limit how much you have to:     0?1 drink a day for women.     0?2 drinks a day for men.     Know how much alcohol is in your drink. In the [place] one drink equals one 12 oz bottle of beer (355 mL), one 5 oz glass of wine (148 mL), or one 1? oz glass of hard liquor (44 mL).  Lifestyle        Work with your health care provider to maintain a healthy body weight or to lose weight. Ask what an ideal weight is for you.     Get at least 30 minutes of exercise that causes your heart to beat faster (aerobic  exercise) most days of the week. Activities may include walking, swimming, or biking.     Include exercise to strengthen your muscles (resistance exercise), such as Pilates or lifting weights, as part of your weekly exercise routine. Try to do these types of exercises for 30 minutes at least 3 days a week.    Do not  use any products that contain nicotine or tobacco. These products include cigarettes, chewing tobacco, and vaping devices, such as e-cigarettes. If you need help quitting, ask your health care provider.     Monitor your blood pressure at home as told by your health care provider.     Keep all follow-up visits. This is important.       Medicines     Take over-the-counter and prescription medicines only as told by your health care provider. Follow directions carefully. Blood pressure medicines must be taken as prescribed.    Do not  skip doses of blood pressure medicine. Doing this puts you at risk for problems and can make the medicine less effective.     Ask your health care provider about side effects or reactions to medicines that you should watch for.       Contact a health care provider if you:       Think you are having a reaction to a medicine you are taking.     Have headaches that keep coming back (recurring).     Feel dizzy.     Have swelling in your ankles.     Have trouble with your vision.     Get help right away if you:       Develop a severe headache or confusion.     Have unusual weakness or numbness.     Feel faint.     Have severe pain in your chest or abdomen.     Vomit repeatedly.     Have trouble breathing.     These symptoms may be an emergency. Get help right away. Call 911.    Do not wait to see if the symptoms will go away.    Do not drive yourself to the hospital.     Summary       Hypertension is when the force of blood pumping through your arteries is too strong. If this condition is not controlled, it may put you at risk for serious complications.     Your personal target  blood pressure may vary depending on your medical conditions, your age, and other factors. For most people, a normal blood pressure is less than 120/80.     Hypertension is treated with lifestyle changes, medicines, or a combination of both. Lifestyle changes include losing weight, eating a healthy, low-sodium diet, exercising more, and limiting alcohol.     This information is not intended to replace advice given to you by your health care provider. Make sure you discuss any questions you have with your health care provider.     Document Released: 06/07/2005 Document Revised: 04/14/2021 Document Reviewed: 04/14/2021     Elsevier Patient Education ? 2023 [employer]

## 2022-01-16 NOTE — ED Triage Notes (Signed)
Was outside in garden this morning. Feeling hot outside, came into house, states started sneezing at least 20 times. Felt like throat was closing, felt blocked. Reports trouble managing secretions and states small amt vomit x1 at home. Here in triage, patient is speaking clearly, managing secretions but states throat still feels tight.

## 2022-01-16 NOTE — ED Provider Notes (Signed)
HPI   Chief Complaint   Patient presents with   . Shortness of Breath         History provided by:  Patient  Language interpreter used: No    Illness  Quality:  Sneezing + trouble swallowing  Severity:  Moderate  Onset quality:  Sudden  Duration:  20 minutes  Timing:  Intermittent  Progression:  Resolved  Chronicity:  New  Relieved by:  Rest in ed waiting room  Worsened by:  Heat + cold exposure earlier today  Associated symptoms: congestion and cough    Associated symptoms: no abdominal pain, no chest pain, no diarrhea, no fever, no headaches, no loss of consciousness, no myalgias, no nausea, no rhinorrhea, no shortness of breath, no sore throat, no vomiting and no wheezing                  Glasgow Coma Scale Score: 15                                  Patient History   No past medical history on file.  No past surgical history on file.  No family history on file.  Social History     Tobacco Use   . Smoking status: Not on file   . Smokeless tobacco: Not on file   Substance Use Topics   . Alcohol use: Not on file   . Drug use: Not on file       Review of Systems   Review of Systems   Constitutional: Negative for chills and fever.   HENT: Positive for congestion, sneezing and trouble swallowing. Negative for drooling, facial swelling, mouth sores, nosebleeds, postnasal drip, rhinorrhea, sinus pain, sore throat and voice change.    Eyes: Negative.    Respiratory: Positive for cough. Negative for chest tightness, shortness of breath and wheezing.    Cardiovascular: Negative for chest pain, palpitations and leg swelling.   Gastrointestinal: Negative for abdominal pain, diarrhea, nausea and vomiting.   Endocrine: Negative.    Genitourinary: Negative.    Musculoskeletal: Negative for myalgias, neck pain and neck stiffness.   Skin: Negative.    Allergic/Immunologic: Negative for immunocompromised state.   Neurological: Negative for dizziness, tremors, loss of consciousness, facial asymmetry, speech difficulty, weakness,  light-headedness and headaches.   Hematological: Negative.    Psychiatric/Behavioral: The patient is not nervous/anxious.        Physical Exam   ED Triage Vitals [01/16/22 1519]   Temp Pulse Resp BP   37.6 C (99.6 F) 98 18 162/75      SpO2 Temp Source Heart Rate Source Patient Position   98 % Oral -- --      BP Location FiO2 (%)     Left arm --       Physical Exam  Vitals and nursing note reviewed.   Constitutional:       General: He is not in acute distress.     Appearance: He is well-developed. He is not ill-appearing or diaphoretic.   HENT:      Head: Normocephalic.      Mouth/Throat:      Mouth: Mucous membranes are moist. No oral lesions or angioedema.      Pharynx: Oropharynx is clear. Uvula midline. No pharyngeal swelling, oropharyngeal exudate, posterior oropharyngeal erythema or uvula swelling.      Comments: Airway patent  Eyes:      General: Vision grossly  intact.      Extraocular Movements: Extraocular movements intact.      Conjunctiva/sclera: Conjunctivae normal.      Right eye: Right conjunctiva is not injected.      Left eye: Left conjunctiva is not injected.   Cardiovascular:      Rate and Rhythm: Normal rate and regular rhythm.      Heart sounds: Normal heart sounds. No murmur heard.  Pulmonary:      Effort: Pulmonary effort is normal. No tachypnea or respiratory distress.      Breath sounds: Normal breath sounds. No stridor.   Chest:      Chest wall: No deformity.   Abdominal:      General: Bowel sounds are normal. There is no distension.   Musculoskeletal:         General: Normal range of motion.      Right lower leg: No edema.      Left lower leg: No edema.   Skin:     General: Skin is warm and dry.      Capillary Refill: Capillary refill takes less than 2 seconds.      Coloration: Skin is not cyanotic or pale.   Neurological:      General: No focal deficit present.      Mental Status: He is alert and oriented to person, place, and time. Mental status is at baseline.      GCS: GCS eye subscore  is 4. GCS verbal subscore is 5. GCS motor subscore is 6.      Cranial Nerves: Cranial nerves 2-12 are intact. No cranial nerve deficit or facial asymmetry.      Sensory: Sensation is intact.      Motor: Motor function is intact.      Coordination: Coordination is intact.      Gait: Gait is intact.   Psychiatric:         Attention and Perception: Attention normal.         Mood and Affect: Mood normal. Mood is not anxious.         Speech: Speech normal.         Behavior: Behavior normal. Behavior is cooperative.         Thought Content: Thought content normal.       XR NECK SOFT TISSUE   Final Result   No acute findings.      Cihan Duzgol 01/16/2022 8:06 PM          Labs Reviewed - No data to display    ED Course & MDM   ED Course as of 01/16/22 2019   Sat Jan 16, 2022   2013 Sx resolved bp remains slightly elevated   2019 St neck xray nad         Diagnoses as of 01/16/22 2019   Allergic reaction, initial encounter   Dysphagia, unspecified type   Secondary hypertension       Medical Decision Making  Allergic reaction, initial encounter: acute illness or injury  Dysphagia, unspecified type: acute illness or injury  Secondary hypertension: chronic illness or injury  Amount and/or Complexity of Data Reviewed  External Data Reviewed: notes.  Radiology: ordered and independent interpretation performed. Decision-making details documented in ED Course.      Risk  Prescription drug management.  Decision regarding hospitalization.                Zebedee Iba, MD  01/16/22 2019

## 2022-03-31 LAB — CREATININE W/ EGFR (EXT)
Creatinine (EXT): 0.86 mg/dL (ref 0.50–1.40)
eGFR - Creat CKD-EPI (EXT): >60 (ref >60)

## 2022-03-31 LAB — LIPID PROFILE (EXT)
Chol/HDL Ratio (EXT): 2.9 (ref <=4.9)
Cholesterol (EXT): 178 mg/dL (ref <=199)
HDL Cholesterol (EXT): 62 mg/dL (ref >=41)
LDL Cholesterol, CALC (EXT): 94.8 mg/dL (ref <=130)
Triglycerides (EXT): 106 mg/dL (ref <=149)

## 2022-03-31 LAB — UNMAPPED LAB RESULTS: Potassium (EXT): 3.8 mmol/L (ref 3.3–5.3)

## 2022-04-10 ENCOUNTER — Inpatient Hospital Stay
Admit: 2022-04-10 | Disposition: A | Discharge status: Home / Self Care | Payer: MEDICARE | Attending: Nurse Practitioner | Primary: Internal Medicine

## 2022-04-10 DIAGNOSIS — U071 COVID-19: Secondary | ICD-10-CM

## 2022-04-10 LAB — POCT GLUCOSE: POCT Glucose: 129 mg/dL — ABNORMAL HIGH (ref 70–110)

## 2022-04-10 LAB — POCT SARS-COV-2/FLU A&B
POCT Flu A: NOT DETECTED
POCT Flu B: NOT DETECTED
POCT SARS-CoV-2: DETECTED — AB

## 2022-04-10 MED ORDER — ondansetron (Zofran) 4 mg tablet
4 | ORAL_TABLET | Freq: Three times a day (TID) | ORAL | 0 refills | Status: DC | PRN
Start: 2022-04-10 — End: 2022-04-20

## 2022-04-10 NOTE — ED Provider Notes (Signed)
History  Chief Complaint   Patient presents with   . Vomiting     Reports had flu shot on Monday, & next day didn't feel good, since has had chills, vomiting, dizziness & loss of appetite.     See MDM          Past Medical History:   Diagnosis Date   . Hyperlipidemia    . Hypertension        Past Surgical History:   Procedure Laterality Date   . NO PAST SURGERIES         No family history on file.    Social History     Tobacco Use   . Smoking status: Never   . Smokeless tobacco: Never   Substance Use Topics   . Alcohol use: Not on file   . Drug use: Not on file       Review of Systems   Constitutional: Positive for activity change, appetite change and chills. Negative for fever.   Respiratory: Positive for cough (cough on Tuesday better now). Negative for shortness of breath and wheezing.    Gastrointestinal: Positive for vomiting (wednesday, and today).   Genitourinary:        Baseline   Prostate CA     Musculoskeletal: Positive for myalgias. Negative for back pain, neck pain and neck stiffness.   Skin: Negative.    Neurological: Positive for dizziness and light-headedness.       Physical Exam  Vitals:    04/10/22 1327   BP: 133/74   BP Location: Left arm   Patient Position: Sitting   Pulse: 84   Resp: 20   Temp: 36.9 C (98.4 F)   TempSrc: Oral   SpO2: 97%   Weight: 82.6 kg   Height: 1.6 m       Physical Exam  Constitutional:       Appearance: Normal appearance.   HENT:      Mouth/Throat:      Mouth: Mucous membranes are dry.      Pharynx: No posterior oropharyngeal erythema.      Comments: Slightly tacky  Eyes:      Extraocular Movements: Extraocular movements intact.      Pupils: Pupils are equal, round, and reactive to light.   Cardiovascular:      Rate and Rhythm: Normal rate and regular rhythm.      Heart sounds: Normal heart sounds.   Pulmonary:      Breath sounds: Normal breath sounds.   Abdominal:      Palpations: Abdomen is soft.      Tenderness: There is no abdominal tenderness.   Musculoskeletal:          General: Normal range of motion.      Cervical back: Normal range of motion.   Skin:     General: Skin is warm.   Neurological:      Mental Status: He is alert and oriented to person, place, and time.              No orders to display     Labs Reviewed   POCT GLUCOSE - Abnormal       Result Value    POCT Glucose 129 (*)    POCT SARS-COV-2/FLU A&B - Abnormal    POCT SARS-CoV-2 Detected (*)     POCT Flu A Not Detected      POCT Flu B Not Detected      Narrative:     This test  was performed with the [facility] SARS-COV-2 & Influenza A/B RNA by RT PCR molecular test. Not detected results do not rule out infection with COVID-19, Influenza A and/or Influenza B. This test is offered under Emergency use Authorization by the FDA.       Procedures  Procedures    [facility] Course  Diagnoses as of 04/10/22 1416   COVID-19   Vomiting without nausea, unspecified vomiting type       Medical Decision Making  81 year old male who states he got his flu shot on Monday and since then has been feeling unwell.  He states on Tuesday he had a cough but that has since resolved.  He states he has been vomiting intermittently since Tuesday most recently this morning after drinking tea.  He states he cannot tolerate  Foods but drinking lots of fluid  States he has had chills and overall body aches    Patient's vital signs are stable he does not have a fever  Blood sugar in the clinic today was 129    COVID Positive  FLU Negative    Given clinical presentation stable vital signs no fever no cough, able to drive, neuro intact  Will offer him some Zofran so he can increase his p.o. intake will encourage him to continue with fluids as he has been  Recommend symptom management with over-the-counter products as desired    If unable to tolerate fluids in the next 24 hours recommend going to emergency department    Amount and/or Complexity of Data Reviewed  Labs: ordered.          Discharge Meds  ED Prescriptions     Medication Sig Dispense Start Date  End Date Auth. Provider    ondansetron (Zofran) 4 mg tablet Take 1 tablet (4 mg) by mouth every 8 (eight) hours if needed for nausea for up to 5 days. 15 tablet 04/10/2022 04/15/2022 Jettie Booze, NP          Home Meds  Prior to Admission medications    Medication Sig Start Date End Date Taking? Authorizing Provider   losartan (Cozaar) 100 mg tablet Take 1 tablet by mouth once daily. 04/30/16  Yes [facility], RN   amLODIPine (Norvasc) 10 mg tablet Take 10 mg by mouth once daily. 03/26/22   [facility], RN   ascorbic acid (Vitamin C) 1,000 mg tablet Take by mouth once daily.    [facility], RN   aspirin 81 mg EC tablet Take 81 mg by mouth in the morning.    [facility], RN   LYCOPENE ORAL Take 1 tablet by mouth in the morning.    [facility], RN   omega-3 fatty acids-fish oil 340-1,000 mg capsule Take by mouth in the morning.    [facility], RN   simvastatin (Zocor) 10 mg tablet SMARTSIG:1 Tablet(s) By Mouth Every Evening 03/25/22   [facility], RN   Meredith Leeds 1.479-0.188- 0.225 gram tablet SMARTSIG:12 Tablet(s) By Mouth Daily 01/26/22   N[facility], RN         Total amount of time spent on day of service doing chart review, history and physical exam, order/ review results of testing ordered (if any), patient counseling, documentation: 40 minutes.      Patient encounter note may have been created using voice recognition software and in real time during the office visit. Please excuse any typographical errors that may not have been edited out.  Jettie Booze, NP  04/10/22 1416

## 2022-04-10 NOTE — Discharge Instructions (Addendum)
You have tested positive for COVID-19    Recommend you follow CDC guidelines for isolation and quarantine    Quarantine for minimum of 5 days always wearing a mask when with others followed by 5 additional days of masking if out in the community    Use Zofran for any nausea/vomiting you need to increase fluids    Small light meals throughout the day    If you remain unable to keep fluids down in the next 24 hours please seek out medical attention at the emergency department for further evaluation and treatment recommendations

## 2022-04-10 NOTE — Other (Signed)
Patient Education   Table of Contents     10 Things You Can Do to Manage Your COVID-19 Symptoms at Home     COVID-19     To view videos and all your education online visit,   [email or web address]   or scan this QR code with your smartphone.   Access to this content will expire in one year.                    10 Things You Can Do to Manage Your COVID-19 Symptoms at Home     If you have possible or confirmed COVID-19    Stay home  except to get medical care.    Monitor your symptoms  carefully. If your symptoms get worse, call your healthcare provider immediately.    Get rest and stay hydrated.     If you have a medical appointment, call the healthcare provider  ahead of time and tell them that you have or may have COVID-19.     For medical emergencies, call 911 and notify the dispatch personnel  that you have or may have COVID-19.    Cover your cough and sneezes  with a tissue or use the inside of your elbow.    Wash your hands often  with soap and water for at least 20 seconds or clean your hands with an alcohol-based hand sanitizer that contains at least 60% alcohol.     As much as possible, stay  in a specific room and away from other people  in your home. Also, you should use a separate bathroom, if available. If you need to be around other people in or outside of the home, wear a mask.    Avoid sharing personal items  with other people in your household, like dishes, towels, and bedding.    Clean all surfaces  that are touched often, like counters, tabletops, and doorknobs. Use household cleaning sprays or wipes according to the label instructions.     [email or web address]     01/04/2020   This information is not intended to replace advice given to you by your health care provider. Make sure you discuss any questions you have with your health care provider.     Document Released: 05/24/2019 Document Revised: 04/22/2021 Document Reviewed: 04/22/2021     Elsevier Patient Education ? 2022 [employer]          COVID-19     COVID-19, or coronavirus disease 2019, is an infection that is caused by a new (novel) coronavirus called SARS-CoV-2. COVID-19 can cause many symptoms. In some people, the virus may not cause any symptoms. In others, it may cause mild or severe symptoms. Some people with severe infection develop severe disease.   What are the causes?       This illness is caused by a virus. The virus may be in the air as tiny specks of fluid (aerosols) or droplets, or it may be on surfaces. You may catch the virus by:     Breathing in droplets from an infected person. Droplets can be spread by a person breathing, speaking, singing, coughing, or sneezing.     Touching something, like a table or a doorknob, that has virus on it (is contaminated) and then touching your mouth, nose, or eyes.       What increases the risk?   Risk for infection:  You are more likely to get infected with the COVID-19 virus if:     You are  within 6 ft (1.8 m) of a person with COVID-19 for 15 minutes or longer.     You are providing care for a person who is infected with COVID-19.     You are in close personal contact with other people. Close personal contact includes hugging, kissing, or sharing eating or drinking utensils.     Risk for serious illness caused by COVID-19:  You are more likely to get seriously ill from the COVID-19 virus if:     You have cancer.     You have a long-term (chronic) disease, such as:     Chronic lung disease. This includes pulmonary embolism, chronic obstructive pulmonary disease, and cystic fibrosis.     Long-term disease that lowers your body's ability to fight infection (immunocompromise).     Serious cardiac conditions, such as heart failure, coronary artery disease, or cardiomyopathy.     Diabetes.     Chronic kidney disease.     Liver diseases. These include cirrhosis, nonalcoholic fatty liver disease, alcoholic liver disease, or autoimmune hepatitis.     You have obesity.     You are pregnant or were  recently pregnant.     You have sickle cell disease.     What are the signs or symptoms?    Symptoms of this condition can range from mild to severe. Symptoms may appear any time from 2 to 14 days after being exposed to the virus. They include:     Fever or chills.     Shortness of breath or trouble breathing.     Feeling tired or very tired.     Headaches, body aches, or muscle aches.     Runny or stuffy nose, sneezing, coughing, or sore throat.     New loss of taste or smell. This is rare.     Some people may also have stomach problems, such as nausea, vomiting, or diarrhea.   Other people may not have any symptoms of COVID-19.   How is this diagnosed?       This condition may be diagnosed by testing samples to check for the COVID-19 virus. The most common tests are the PCR test and the antigen test. Tests may be done in the lab or at home. They include:     Using a swab to take a sample of fluid from the back of your nose and throat (nasopharyngeal fluid), from your nose, or from your throat.     Testing a sample of saliva from your mouth.     Testing a sample of coughed-up mucus from your lungs (sputum).       How is this treated?    Treatment for COVID-19 infection depends on the severity of the condition.     Mild symptoms can be managed at home with rest, fluids, and over-the-counter medicines.     Serious symptoms may be treated in a hospital intensive care unit (ICU). Treatment in the ICU may include:     Supplemental oxygen. Extra oxygen is given through a tube in the nose, a face mask, or a hood.     Medicines. These may include:     Antivirals, such as monoclonal antibodies. These help your body fight off certain viruses that can cause disease.     Anti-inflammatories, such as corticosteroids. These reduce inflammation and suppress the immune system.     Antithrombotics. These prevent or treat blood clots, if they develop.     Convalescent plasma. This helps boost your immune  system, if you have an  underlying immunosuppressive condition or are getting immunosuppressive treatments.         Prone positioning. This means you will lie on your stomach. This helps oxygen to get into your lungs.     Infection control measures.     If you are at risk for more serious illness caused by COVID-19, your health care provider may prescribe two long-acting monoclonal antibodies, given together every 6 months.   How is this prevented?   To protect yourself:     Use preventive medicine (pre-exposure prophylaxis). You may get pre-exposure prophylaxis if you have moderate or severe immunocompromise.     Get vaccinated. Anyone 84 months old or older who meets guidelines can get a COVID-19 vaccine or vaccine series. This includes people who are pregnant or making breast milk (lactating).     Get an added dose of COVID-19 vaccine after your first vaccine or vaccine series if you have moderate to severe immunocompromise. This applies if you have had a solid organ transplant or have been diagnosed with an immunocompromising condition.     You should get the added dose 4 weeks after you got the first COVID-19 vaccine or vaccine series.     If you get an mRNA vaccine, you will need a 3-dose primary series.     If you get the J&J/Janssen vaccine, you will need a 2-dose primary series, with the second dose being an mRNA vaccine.     Talk to your health care provider about getting experimental monoclonal antibodies. This treatment is approved under emergency use authorization to prevent severe illness before or after being exposed to the COVID-19 virus. You may be given monoclonal antibodies if:     You have moderate or severe immunocompromise. This includes treatments that lower your immune response. People with immunocompromise may not develop protection against COVID-19 when they are vaccinated.     You cannot be vaccinated. You may not get a vaccine if you have a severe allergic reaction to the vaccine or its components.     You are  not fully vaccinated.     You are in a facility where COVID-19 is present and:     Are in close contact with a person who is infected with the COVID-19 virus.     Are at high risk of being exposed to the COVID-19 virus.     You are at risk of illness from new variants of the COVID-19 virus.     To protect others:  If you have symptoms of COVID-19, take steps to prevent the virus from spreading to others.     Stay home. Leave your house only to get medical care. Do not  use public transit, if possible.    Do not  travel while you are sick.     Wash your hands often with soap and water for at least 20 seconds. If soap and water are not available, use alcohol-based hand sanitizer.     Make sure that all people in your household wash their hands well and often.     Cough or sneeze into a tissue or your sleeve or elbow. Do not  cough or sneeze into your hand or into the air.     Where to find more information       Centers for Disease Control and Prevention: https://www.clark-whitaker.org/       World Health Organization: https://thompson-craig.com/       Get help right away if:  You have trouble breathing.     You have pain or pressure in your chest.     You are confused.     You have bluish lips and fingernails.     You have trouble waking from sleep.     You have symptoms that get worse.     These symptoms may be an emergency. Get help right away. Call 911.    Do not wait to see if the symptoms will go away.    Do not drive yourself to the hospital.     Summary       COVID-19 is an infection that is caused by a new coronavirus.     Sometimes, there are no symptoms. Other times, symptoms range from mild to severe. Some people with a severe COVID-19 infection develop severe disease.     The virus that causes COVID-19 can spread from person to person through droplets or aerosols from breathing, speaking, singing, coughing, or sneezing.     Mild symptoms of COVID-19 can be managed at home with rest, fluids, and  over-the-counter medicines.     This information is not intended to replace advice given to you by your health care provider. Make sure you discuss any questions you have with your health care provider.     Document Released: 07/13/2018 Document Revised: 05/26/2021 Document Reviewed: 05/28/2021     Elsevier Patient Education ? 2023 Else[employer] Inc.

## 2022-04-18 ENCOUNTER — Inpatient Hospital Stay
Admission: EM | Admit: 2022-04-18 | Discharge: 2022-04-20 | Disposition: A | Discharge status: Home / Self Care | Payer: MEDICARE | Admitting: Internal Medicine

## 2022-04-18 ENCOUNTER — Emergency Department: Admit: 2022-04-18 | Payer: MEDICARE | Primary: Internal Medicine

## 2022-04-18 DIAGNOSIS — E871 Hypo-osmolality and hyponatremia: Secondary | ICD-10-CM

## 2022-04-18 LAB — COMPREHENSIVE METABOLIC PANEL
ALT: 24 U/L (ref 0–55)
AST: 12 U/L (ref 6–42)
Albumin: 3.7 g/dL (ref 3.2–5.0)
Alkaline phosphatase: 52 U/L (ref 30–130)
Anion Gap: 7 mmol/L (ref 3–14)
BUN: 10 mg/dL (ref 6–24)
Bilirubin, total: 0.7 mg/dL (ref 0.2–1.2)
CO2 (Bicarbonate): 25 mmol/L (ref 20–32)
Calcium: 8.9 mg/dL (ref 8.5–10.5)
Chloride: 90 mmol/L — ABNORMAL LOW (ref 98–110)
Creatinine: 0.79 mg/dL (ref 0.55–1.30)
Glucose: 160 mg/dL — ABNORMAL HIGH (ref 70–110)
Potassium: 3.8 mmol/L (ref 3.6–5.2)
Protein, total: 7.5 g/dL (ref 6.0–8.4)
Sodium: 122 mmol/L — ABNORMAL LOW (ref 135–146)
eGFRcr: 90 mL/min/{1.73_m2} (ref >=60)

## 2022-04-18 LAB — RAINBOW DRAW SST GOLD TOP

## 2022-04-18 LAB — LIGHT BLUE TOP

## 2022-04-18 LAB — CBC WITH DIFFERENTIAL
Basophils %: 0.4 %
Basophils Absolute: 0.04 10*3/uL (ref 0.00–0.22)
Eosinophils %: 1.1 %
Eosinophils Absolute: 0.1 10*3/uL (ref 0.00–0.50)
Hematocrit: 39.2 % (ref 37.0–53.0)
Hemoglobin: 14 g/dL (ref 13.0–17.5)
Immature Granulocytes %: 1.1 %
Immature Granulocytes Absolute: 0.1 10*3/uL (ref 0.00–0.10)
Lymphocyte %: 17 %
Lymphocytes Absolute: 1.59 10*3/uL (ref 0.70–4.00)
MCH: 31.2 pg (ref 26.0–34.0)
MCHC: 35.7 g/dL (ref 31.0–37.0)
MCV: 87.3 fL (ref 80.0–100.0)
MPV: 10 fL (ref 9.1–12.4)
Monocytes %: 11.7 %
Monocytes Absolute: 1.1 10*3/uL — ABNORMAL HIGH (ref 0.38–0.83)
NRBC %: 0 % (ref 0.0–0.0)
NRBC Absolute: 0 10*3/uL (ref 0.00–2.00)
Neutrophil %: 68.7 %
Neutrophils Absolute: 6.45 10*3/uL (ref 1.50–7.95)
Platelets: 286 10*3/uL (ref 150–400)
RBC: 4.49 M/uL (ref 4.20–5.90)
RDW-CV: 11.2 % — ABNORMAL LOW (ref 11.5–14.5)
RDW-SD: 36.4 fL (ref 35.0–51.0)
WBC: 9.4 10*3/uL (ref 4.0–11.0)

## 2022-04-18 LAB — BASIC METABOLIC PANEL
Anion Gap: 7 mmol/L (ref 3–14)
BUN: 10 mg/dL (ref 6–24)
CO2 (Bicarbonate): 26 mmol/L (ref 20–32)
Calcium: 8.8 mg/dL (ref 8.5–10.5)
Chloride: 90 mmol/L — ABNORMAL LOW (ref 98–110)
Creatinine: 0.73 mg/dL (ref 0.55–1.30)
Glucose: 110 mg/dL (ref 70–110)
Potassium: 3.8 mmol/L (ref 3.6–5.2)
Sodium: 123 mmol/L — ABNORMAL LOW (ref 135–146)
eGFRcr: 93 mL/min/{1.73_m2} (ref >=60)

## 2022-04-18 LAB — TROPONIN I, HIGH SENSITIVITY: Troponin I, High Sensitivity: 6 ng/L

## 2022-04-18 LAB — OSMOLALITY: Osmolality, Serum: 257 mosm/kg — ABNORMAL LOW (ref 275–300)

## 2022-04-18 MED ORDER — bisacodyl (Dulcolax) EC tablet 10 mg
5 | Freq: Every day | ORAL | Status: DC | PRN
Start: 2022-04-18 — End: 2022-04-20

## 2022-04-18 MED ORDER — aspirin EC tablet 81 mg
81 | Freq: Every day | ORAL | Status: DC
Start: 2022-04-18 — End: 2022-04-20
  Administered 2022-04-19 – 2022-04-20 (×2): 81 mg via ORAL

## 2022-04-18 MED ORDER — pravastatin (Pravachol) tablet 40 mg
20 | Freq: Every evening | ORAL | Status: DC
Start: 2022-04-18 — End: 2022-04-20
  Administered 2022-04-19 – 2022-04-20 (×2): 40 mg via ORAL

## 2022-04-18 MED ORDER — amLODIPine (Norvasc) tablet 10 mg
5 | Freq: Every day | ORAL | Status: DC
Start: 2022-04-18 — End: 2022-04-20
  Administered 2022-04-19 – 2022-04-20 (×2): 10 mg via ORAL

## 2022-04-18 MED ORDER — ibuprofen tablet 400 mg
200 | ORAL | Status: DC | PRN
Start: 2022-04-18 — End: 2022-04-20

## 2022-04-18 MED ORDER — traMADol (Ultram) tablet 50 mg
50 | Freq: Four times a day (QID) | ORAL | Status: DC | PRN
Start: 2022-04-18 — End: 2022-04-20

## 2022-04-18 MED ORDER — ondansetron (Zofran) injection 4 mg
4 | Freq: Three times a day (TID) | INTRAMUSCULAR | Status: DC | PRN
Start: 2022-04-18 — End: 2022-04-20

## 2022-04-18 MED ORDER — atorvastatin (Lipitor) tablet 10 mg
10 | Freq: Every evening | ORAL | Status: DC
Start: 2022-04-18 — End: 2022-04-18

## 2022-04-18 MED ORDER — ALPRAZolam (Xanax) tablet 0.5 mg
0.25 | Freq: Three times a day (TID) | ORAL | Status: DC | PRN
Start: 2022-04-18 — End: 2022-04-20

## 2022-04-18 MED ORDER — acetaminophen (Tylenol) tablet 650 mg
325 | Freq: Four times a day (QID) | ORAL | Status: DC | PRN
Start: 2022-04-18 — End: 2022-04-20

## 2022-04-18 MED ORDER — acetaminophen (Tylenol) suppository 650 mg
650 | Freq: Four times a day (QID) | RECTAL | Status: DC | PRN
Start: 2022-04-18 — End: 2022-04-20

## 2022-04-18 MED ORDER — naloxone (Narcan) injection 0.1 mg
0.4 | INTRAMUSCULAR | Status: DC | PRN
Start: 2022-04-18 — End: 2022-04-20

## 2022-04-18 MED ORDER — traMADol (Ultram) tablet 100 mg
50 | Freq: Three times a day (TID) | ORAL | Status: DC | PRN
Start: 2022-04-18 — End: 2022-04-20

## 2022-04-18 MED ORDER — sodium chloride 0.9 % infusion
0.9 | INTRAVENOUS | Status: DC
Start: 2022-04-18 — End: 2022-04-20
  Administered 2022-04-18 – 2022-04-19 (×2): 100 mL/h via INTRAVENOUS

## 2022-04-18 MED ORDER — acetaminophen (Tylenol) solution 650 mg
325 | Freq: Four times a day (QID) | ORAL | Status: DC | PRN
Start: 2022-04-18 — End: 2022-04-20

## 2022-04-18 MED ORDER — ibuprofen suspension 400 mg
100 | ORAL | Status: DC | PRN
Start: 2022-04-18 — End: 2022-04-20

## 2022-04-18 MED ORDER — ondansetron ODT (Zofran-ODT) disintegrating tablet 4 mg
4 | Freq: Three times a day (TID) | ORAL | Status: DC | PRN
Start: 2022-04-18 — End: 2022-04-20

## 2022-04-18 MED ORDER — traZODone (Desyrel) tablet 50 mg
50 | Freq: Every evening | ORAL | Status: DC | PRN
Start: 2022-04-18 — End: 2022-04-20

## 2022-04-18 MED FILL — AMLODIPINE 5 MG TABLET: 5 5 mg | ORAL | Qty: 2

## 2022-04-18 MED FILL — ASPIRIN 81 MG TABLET,DELAYED RELEASE: 81 81 mg | ORAL | Qty: 1

## 2022-04-18 NOTE — ED Triage Notes (Signed)
Patient report sweats chills and dizziness since last night  Patient has covid

## 2022-04-18 NOTE — H&P (Signed)
[facility] EMERGENCY DEPARTMENT MAIN CAMPUS  [address]  [place] [address]  [phone]      History Of Present Illness  Carlos Moody is a 81 y.o. male with PMHX significant for HTN, HLD who comes in presenting with weakness and some dizziness.   Pt stated he tested positive for COVID on October 21st.  Since testing positive, pt had nausea, vomiting and cough.  Pt denies much chest pain. Pt stated that last night his dizziness got worse and came to the ED for further evaluation.         Subjective   Past Medical History  He has a past medical history of Hyperlipidemia and Hypertension.    Surgical History  He has a past surgical history that includes No past surgeries.     Social History  He reports that he has never smoked. He has never used smokeless tobacco. No history on file for alcohol use and drug use.     Allergies  Mango and Pistachio nut     Medications  (Not in a hospital admission)      Review of Systems   Constitutional: Positive for fatigue.   Neurological: Positive for dizziness, weakness and light-headedness.   All other systems reviewed and are negative.      Objective   Physical Exam  Constitutional:       Appearance: Normal appearance.   HENT:      Head: Normocephalic and atraumatic.   Eyes:      Conjunctiva/sclera: Conjunctivae normal.   Cardiovascular:      Rate and Rhythm: Normal rate and regular rhythm.      Pulses: Normal pulses.   Pulmonary:      Effort: Pulmonary effort is normal.      Breath sounds: Normal breath sounds.   Abdominal:      General: Abdomen is flat. Bowel sounds are normal.      Palpations: Abdomen is soft.   Musculoskeletal:         General: Normal range of motion.   Skin:     General: Skin is warm.   Neurological:      General: No focal deficit present.      Mental Status: He is alert and oriented to person, place, and time.   Psychiatric:         Mood and Affect: Mood normal.         Behavior: Behavior normal.         Last Recorded Vitals  Blood pressure (!)  172/88, pulse 90, temperature 37.1 C (98.7 F), temperature source Oral, resp. rate 18, height 1.702 m, weight 82 kg, SpO2 98%.    Relevant Results:  CXR:  1. Mild left lower lobe atelectasis.  2. Mid and lower thoracic spine mild anterior wedge compression deformities, age-indeterminate. Correlation with focal pain symptoms is advised.  3. Right-sided diaphragmatic eventration.      Assessment/Plan   Principal Problem:    Hyponatremia  1) Weakness 2/2 to hyponatremia  - Pt will be admitted to [facility]  - Hold Losartan  - Hydrate with NS at 100 cc/hr  - BMP Q 4hrs  - Urine osmolality and Na ordered by ED and is pending    2) HTN  - Hold Losartan 2/2 to #1  - Pt to continue on amlodipine    3) Recent COVID  - Pt tested (+) on 9/21 --> past 5 days since (+) and feeling better  - Will hold of on precautions  4) HLD  - Pt to continue on statin    DVT: SCDs  CODE:

## 2022-04-18 NOTE — ED Notes (Signed)
Son mark updated on dads status as ok'd by patient.  Son to drop off the [employer], [place]  04/18/22 [phone]

## 2022-04-18 NOTE — ED Provider Notes (Signed)
HPI   Chief Complaint   Patient presents with   . Shortness of Breath       HPI    Patient with history of hypertension, hyperlipidemia presents stating that he had been diagnosed with COVID on the 21st, after having had a few days of nausea and vomiting with cough and congestion and fatigue.  He felt that he was getting better, with decreasing cough, and has been drinking a lot of fluids but still has not had much of an appetite.  Starting last night, he was becoming more weak and dizzy with sweats, fearing that he might fall when he was standing or walking.  He does have a prior history of significant hyponatremia requiring ICU stay in [place], at that time his level was 114 and he had syncopal event.  He denies active alcohol abuse, does drink typically 1 glass of wine a night but no withdrawal symptoms.  No fevers, cough is nonproductive, no peripheral edema, other complaints              Glasgow Coma Scale Score: 15                                  Patient History   Past Medical History:   Diagnosis Date   . Hyperlipidemia    . Hypertension      Past Surgical History:   Procedure Laterality Date   . NO PAST SURGERIES       No family history on file.  Social History     Tobacco Use   . Smoking status: Never   . Smokeless tobacco: Never   Substance Use Topics   . Alcohol use: Not on file   . Drug use: Not on file       Review of Systems   Review of Systems    Physical Exam   ED Triage Vitals   Temp Pulse Resp BP   04/18/22 1136 04/18/22 1136 04/18/22 1136 04/18/22 1136   37.1 C (98.7 F) 101 18 (!) 180/84      SpO2 Temp Source Heart Rate Source Patient Position   04/18/22 1136 04/18/22 1136 04/18/22 1245 04/18/22 1136   97 % Oral Monitor Sitting      BP Location FiO2 (%)     04/18/22 1136 --     Right arm        Physical Exam  Vitals and nursing note reviewed.   Constitutional:       General: He is not in acute distress.     Appearance: He is well-developed.   HENT:      Mouth/Throat:      Mouth: Mucous  membranes are moist.   Eyes:      Extraocular Movements: Extraocular movements intact.      Conjunctiva/sclera: Conjunctivae normal.      Pupils: Pupils are equal, round, and reactive to light.   Cardiovascular:      Rate and Rhythm: Normal rate and regular rhythm.      Heart sounds: No murmur heard.  Pulmonary:      Effort: Pulmonary effort is normal. No respiratory distress.      Breath sounds: Normal breath sounds.   Abdominal:      Palpations: Abdomen is soft.      Tenderness: There is no abdominal tenderness.   Musculoskeletal:         General: No swelling.   Skin:  General: Skin is warm and dry.      Capillary Refill: Capillary refill takes less than 2 seconds.   Neurological:      General: No focal deficit present.      Mental Status: He is alert and oriented to person, place, and time.   Psychiatric:         Mood and Affect: Mood normal.       XR CHEST 2 VIEWS   Final Result      1. Mild left lower lobe atelectasis.   2. Mid and lower thoracic spine mild anterior wedge compression deformities, age-indeterminate. Correlation with focal pain symptoms is advised.   3. Right-sided diaphragmatic eventration.       Delice Bison, MD 04/18/2022 1:03 PM          Labs Reviewed   COMPREHENSIVE METABOLIC PANEL - Abnormal       Result Value    Sodium 122 (*)     Potassium 3.8      Chloride 90 (*)     CO2 (Bicarbonate) 25      Anion Gap 7      BUN 10      Creatinine 0.79      eGFRcr 90      Glucose 160 (*)     Fasting? Unknown      Calcium 8.9      AST 12      ALT 24      Alkaline phosphatase 52      Protein, total 7.5      Albumin 3.7      Bilirubin, total 0.7     CBC WITH DIFFERENTIAL - Abnormal    WBC 9.4      RBC 4.49      Hemoglobin 14.0      Hematocrit 39.2      MCV 87.3      MCH 31.2      MCHC 35.7      RDW-CV 11.2 (*)     RDW-SD 36.4      Platelets 286      MPV 10.0      Neutrophil % 68.7      Lymphocyte % 17.0      Monocytes % 11.7      Eosinophils % 1.1      Basophils % 0.4      Immature Granulocytes % 1.1       NRBC % 0.0      Neutrophils Absolute 6.45      Lymphocytes Absolute 1.59      Monocytes Absolute 1.10 (*)     Eosinophils Absolute 0.10      Basophils Absolute 0.04      Immature Granulocytes Absolute 0.10      NRBC Absolute 0.00     TROPONIN I, HIGH SENSITIVITY - Normal    Troponin I, High Sensitivity 6      Narrative:     Clinical correlation, HEART Score and shared decision making must be taken into account.                   For Chest Pain >3 Hours    Rule-Out Criteria              Single Value     0hr/1hr Delta Value  Male  <10ng/L*          <54ng/L AND delta <15ng/L  Male    <10ng/L*          <  79ng/L AND delta <15ng/L    Cannot Rule-Out                            0hr/1hr Delta Value  Male    <54ng/L AND delta >15ng/L    >/= 54 AND delta </=15ng/L  Male      <79ng/L AND delta >15ng/L    >/= 79 AND delta </=15ng/L    Rule-In Criteria               Single Value   0hr/1hr Delta Value                 Male   >115ng/L       >/=54ng/L AND delta >/=15ng/L         Male     >115ng/L       >/=79ng/L AND delta >/=15ng/L           *Note: for Chest pain <3 hours 0hr/1hr is warranted for evaluation.     CBC W/DIFF    Narrative:     The following orders were created for panel order CBC and differential.  Procedure                               Abnormality         Status                     ---------                               -----------         ------                     CBC w/ Differential[[id number]]          Abnormal            Final result                 Please view results for these tests on the individual orders.   OSMOLALITY   OSMOLALITY, URINE   SODIUM, URINE, RANDOM       ED Course & MDM   Diagnoses as of 04/18/22 1419   Hyponatremia       Patient with significant hyponatremia here, most recent check it had been 138 and now 122, with some weakness and dizziness however normal mental status, will hold on hypertonic at this point.  He has been drinking a lot of fluids this week  D/w dr patel who  accepts      MDM          Earma Reading, MD  04/18/22 [phone]

## 2022-04-19 LAB — URINALYSIS REFLEX TO CULTURE
Bacteria, Ur: NONE SEEN
Bilirubin, Ur: NEGATIVE
Casts, Ur: 0 /LPF (ref 0–4)
Epithelial Cells, UR: 1 {cells}/[HPF] (ref 0–5)
Glucose,Ur: NEGATIVE mg/dL
Ketones, Ur: 15 mg/dL — AB
Nitrite, Ur: NEGATIVE
Protein,Ur: NEGATIVE mg/dL
RBC, Ur: 43 {cells}/[HPF] — ABNORMAL HIGH (ref 0–4)
Specific Gravity, Ur: 1.01 (ref 1.005–1.030)
Urobilinogen, Ur: 1 U/dL (ref 0.2–1.0)
WBC, Ur: 41 {cells}/[HPF] — ABNORMAL HIGH (ref 0–5)
pH, Ur: 6 (ref 5.0–8.0)

## 2022-04-19 LAB — BASIC METABOLIC PANEL
Anion Gap: 4 mmol/L (ref 3–14)
Anion Gap: 5 mmol/L (ref 3–14)
Anion Gap: 7 mmol/L (ref 3–14)
BUN: 10 mg/dL (ref 6–24)
BUN: 10 mg/dL (ref 6–24)
BUN: 8 mg/dL (ref 6–24)
CO2 (Bicarbonate): 24 mmol/L (ref 20–32)
CO2 (Bicarbonate): 29 mmol/L (ref 20–32)
CO2 (Bicarbonate): 29 mmol/L (ref 20–32)
Calcium: 8.4 mg/dL — ABNORMAL LOW (ref 8.5–10.5)
Calcium: 8.6 mg/dL (ref 8.5–10.5)
Calcium: 8.8 mg/dL (ref 8.5–10.5)
Chloride: 92 mmol/L — ABNORMAL LOW (ref 98–110)
Chloride: 95 mmol/L — ABNORMAL LOW (ref 98–110)
Chloride: 98 mmol/L (ref 98–110)
Creatinine: 0.67 mg/dL (ref 0.55–1.30)
Creatinine: 0.77 mg/dL (ref 0.55–1.30)
Creatinine: 0.83 mg/dL (ref 0.55–1.30)
Glucose: 108 mg/dL (ref 70–110)
Glucose: 113 mg/dL — ABNORMAL HIGH (ref 70–110)
Glucose: 124 mg/dL — ABNORMAL HIGH (ref 70–110)
Potassium: 3.7 mmol/L (ref 3.6–5.2)
Potassium: 3.7 mmol/L (ref 3.6–5.2)
Potassium: 4.3 mmol/L (ref 3.6–5.2)
Sodium: 126 mmol/L — ABNORMAL LOW (ref 135–146)
Sodium: 128 mmol/L — ABNORMAL LOW (ref 135–146)
Sodium: 129 mmol/L — ABNORMAL LOW (ref 135–146)
eGFRcr: 89 mL/min/{1.73_m2} (ref >=60)
eGFRcr: 91 mL/min/{1.73_m2} (ref >=60)
eGFRcr: 95 mL/min/{1.73_m2} (ref >=60)

## 2022-04-19 LAB — OSMOLALITY, URINE: Osmolality, urine: 308 mosm/kg (ref 50–1400)

## 2022-04-19 LAB — CBC
Hematocrit: 36.7 % — ABNORMAL LOW (ref 37.0–53.0)
Hemoglobin: 13.4 g/dL (ref 13.0–17.5)
MCH: 32.2 pg (ref 26.0–34.0)
MCHC: 36.5 g/dL (ref 31.0–37.0)
MCV: 88.2 fL (ref 80.0–100.0)
MPV: 10 fL (ref 9.1–12.4)
NRBC %: 0 % (ref 0.0–0.0)
NRBC Absolute: 0 10*3/uL (ref 0.00–2.00)
Platelets: 220 10*3/uL (ref 150–400)
RBC: 4.16 M/uL — ABNORMAL LOW (ref 4.20–5.90)
RDW-CV: 11.4 % — ABNORMAL LOW (ref 11.5–14.5)
RDW-SD: 36.9 fL (ref 35.0–51.0)
WBC: 12.9 10*3/uL — ABNORMAL HIGH (ref 4.0–11.0)

## 2022-04-19 LAB — SODIUM, URINE, RANDOM: Sodium, urine: 40 mmol/L

## 2022-04-19 LAB — LIGHT BLUE TOP

## 2022-04-19 MED FILL — ASPIRIN 81 MG TABLET,DELAYED RELEASE: 81 81 mg | ORAL | Qty: 1

## 2022-04-19 MED FILL — IBUPROFEN 100 MG/5 ML ORAL SUSPENSION: 100 100 mg/5 mL | ORAL | Qty: 20

## 2022-04-19 MED FILL — AMLODIPINE 5 MG TABLET: 5 5 mg | ORAL | Qty: 2

## 2022-04-19 MED FILL — PRAVASTATIN 20 MG TABLET: 20 20 mg | ORAL | Qty: 2

## 2022-04-19 NOTE — Care Plan (Signed)
Problem: Adult Inpatient Plan of Care  Goal: Plan of Care Review  Outcome: Ongoing, Progressing  Goal: Patient-Specific Goal (Individualized)  Outcome: Ongoing, Progressing  Goal: Absence of Hospital-Acquired Illness or Injury  Outcome: Ongoing, Progressing  Goal: Optimal Comfort and Wellbeing  Outcome: Ongoing, Progressing  Goal: Readiness for Transition of Care  Outcome: Ongoing, Progressing   Goal Outcome Evaluation:  Plan of Care Reviewed With: patient  Progress: improving

## 2022-04-19 NOTE — Progress Notes (Signed)
If you are the patient referred to in this chart and reviewing the medical notes, please note that medical documentation is often written with abbreviations in medical terminology.  Physician documentation is typically written by physicians for other physicians to review in order to document what happened, tests that were ordered/interpreted and the diagnosis in the most efficient way possible.  These notes are made available for patients to review but are not specifically written with patient consumption in mind.        HOSPITALIST PROGRESS NOTE      Name: Carlos Moody  MRN: [id number]  Hospital Day: Hospital Day: 2    SUBJECTIVE:       Examined by me, overnight noted no acute events.  Chart reviewed.  Discussed with RN.  This is my first encounter with the patient today.  He is doing a little better today.  Pain no cough no fever    VITAL SIGNS:   Current Vital Signs with 24 Hour Ranges:    Visit Vitals  BP 134/76 (BP Location: Left arm)   Pulse 82   Temp 36.9 C (98.4 F) (Oral)   Resp 17      Oxygen Therapy  SpO2: 96 %  Patient Activity During SpO2 Measurement: At rest  Oxygen Therapy: Supplemental oxygen  O2 Delivery Method: Nasal cannula  O2 Flow Rate (L/min): 2 L/min     Intake/Output Summary (Last 24 hours) at 04/19/2022 1240  Last data filed at 04/19/2022 0537  Gross per 24 hour   Intake 1000 ml   Output 300 ml   Net 700 ml        PHYSICAL EXAMINATION:     General: Sitting comfortably on the bed, not in distress  HEENT: Normocephalic, atraumatic, PERRLA, oral mucosa moist  Neck: Supple, normal ROM  Respiratory: CTAB, no wheezing or crackles, not in labored breathing  Cardiovascular: Normal rate and rhythm, no murmur  Gastrointestinal: Soft, nondistended, nontender, positive bowel sounds  Musculoskeletal: ROM intact all extremities, no edema  Neurologic: AAOx3, CN II to XII intact, normal sensory and motor function  Psychiatric: Calm, cooperative    LABS:     Results from last 7 days   Lab Units  04/19/22  0728 04/18/22  1144   WBC AUTO K/uL 12.9* 9.4   HEMOGLOBIN g/dL 54.013.4 98.114.0   HEMATOCRIT % 36.7* 39.2   PLATELETS AUTO K/uL 220 286         Results from last 7 days   Lab Units 04/19/22  0728 04/18/22  2352 04/18/22  1953   SODIUM mmol/L 129* 128* 126*   POTASSIUM mmol/L 3.7 4.3 3.7   CHLORIDE mmol/L 98 95* 92*   CO2 mmol/L 24 29 29    BUN mg/dL 10 10 8    CREATININE mg/dL 1.910.67 4.780.83 2.950.77   ANION GAP mmol/L 7 4 5      Results from last 7 days   Lab Units 04/19/22  0728 04/18/22  2352 04/18/22  1953   CALCIUM mg/dL 8.4* 8.8 8.6     Results from last 7 days   Lab Units 04/18/22  1144   ALT U/L 24   AST U/L 12   ALK PHOS U/L 52   BILIRUBIN TOTAL mg/dL 0.7           No lab exists for component: LABPROT  Lab Results   Component Value Date    TROPIHS 6 04/18/2022     No results found for: NTPROBNP  Estimated Creatinine Clearance: 82.7 mL/min (by  C-G formula based on SCr of 0.67 mg/dL).    MICROBIOLOGY:     Results     None           IMAGING:         XR CHEST 2 VIEWS   Final Result      1. Mild left lower lobe atelectasis.   2. Mid and lower thoracic spine mild anterior wedge compression deformities, age-indeterminate. Correlation with focal pain symptoms is advised.   3. Right-sided diaphragmatic eventration.       Delice Bison, MD 04/18/2022 1:03 PM           CURRENT MEDICATIONS:     amLODIPine, 10 mg, oral, Daily  aspirin, 81 mg, oral, Daily  pravastatin, 40 mg, oral, Nightly      sodium chloride, 100 mL/hr, Last Rate: 100 mL/hr (04/19/22 0537)      PRN medications: acetaminophen **OR** acetaminophen **OR** acetaminophen, ALPRAZolam, bisacodyl, ibuprofen **OR** ibuprofen, naloxone, ondansetron ODT **OR** ondansetron, traMADol, traMADol, traZODone     ASSESSMENT & PLAN:            81 year old male admitted here to the hospital with    Hyponatremia  1) Weakness 2/2 to hyponatremia  - Hold Losartan  Discontinue IV fluids sodium a little better today 129 recheck labs in AM.    2) HTN    - Pt to continue on  amlodipine    3) Recent COVID  - Pt tested (+) on 9/21 --> past 5 days since (+) and feeling better  Remains on COVID precautions.    4) HLD  - Pt to continue on statin    DVT: SCDs  CODE:  Full  A.m. labs anticipate discharge plan in a.m.      Disclaimer: This note was generated with Dragon Medical voice recognition program.  Please excuse any errors which may have been overlooked during review.  Sometimes, these errors may affect the content or meaning of a given sentence.    Loletta Specter, MD  Hospitalist  [facility] Inpatient Specialist  April 19, 2022   ##

## 2022-04-19 NOTE — Nursing Note (Signed)
Patient transferred to room 2103, remains on Enhanced Precautions for +COVID. Safety maintained.

## 2022-04-19 NOTE — Care Plan (Signed)
Physical Therapy Deferral Note      Patient Name: Carlos Moody  MRN: [id number]  DOB: 1940-08-02    Deferral Reason: No skilled PT needs    Per RN, Baxter Hire, patient has been independent with all mobility, and has no acute PT needs. Will D/C order.    Dahlia Bailiff, PT  [place] lic # [id number]

## 2022-04-19 NOTE — Care Plan (Signed)
Goal Outcome Evaluation:  Problem: Adult Inpatient Plan of Care  Goal: Plan of Care Review  Outcome: Ongoing, Progressing  Flowsheets (Taken 04/19/2022 0214)  Progress: improving  Plan of Care Reviewed With: patient  Goal: Absence of Hospital-Acquired Illness or Injury  Outcome: Ongoing, Progressing  Intervention: Identify and Manage Fall Risk  Flowsheets (Taken 04/19/2022 0214)  Safety Promotion/Fall Prevention:   fall prevention program maintained   lighting adjusted   nonskid shoes/slippers when out of bed   activity supervised  Intervention: Prevent Skin Injury  Flowsheets (Taken 04/19/2022 0214)  Body Position:   turned   position changed independently   sitting up in bed  Skin Protection:   incontinence pads utilized   transparent dressing maintained  Intervention: Prevent and Manage VTE (Venous Thromboembolism) Risk  Flowsheets (Taken 04/19/2022 0214)  Range of Motion: active ROM (range of motion) encouraged  Activity Management:   activity adjusted per tolerance   standing at bedside   up ad lib  VTE Prevention/Management: (Aspirin) other (see comments)  Intervention: Prevent Infection  Flowsheets (Taken 04/19/2022 0214)  Infection Prevention:   personal protective equipment utilized   rest/sleep promoted   single patient room provided   hand hygiene promoted  Goal: Optimal Comfort and Wellbeing  Outcome: Ongoing, Progressing  Intervention: Monitor Pain and Promote Comfort  Flowsheets (Taken 04/19/2022 0214)  Pain Management Interventions:   pain management plan reviewed with patient/caregiver   pillow support provided   position adjusted   quiet environment facilitated   relaxation techniques promoted  Intervention: Provide Person-Centered Care  Flowsheets (Taken 04/19/2022 0214)  Trust Relationship/Rapport:   care explained   questions answered   questions encouraged   reassurance provided   thoughts/feelings acknowledged  Goal: Readiness for Transition of Care  Outcome: Ongoing, Progressing  Intervention:  Mutually Develop Transition Plan  Flowsheets (Taken 04/19/2022 0214)  Equipment Needed After Discharge: none  Equipment Currently Used at Home: none  Anticipated Changes Related to Illness: none  Readmission Within the Last 30 Days: no previous admission in last 30 days  Patient/Family Anticipates Transition to: home with family     Plan of Care Reviewed With: patient  Progress: improving

## 2022-04-20 LAB — CBC
Hematocrit: 38.8 % (ref 37.0–53.0)
Hemoglobin: 13.7 g/dL (ref 13.0–17.5)
MCH: 31.6 pg (ref 26.0–34.0)
MCHC: 35.3 g/dL (ref 31.0–37.0)
MCV: 89.4 fL (ref 80.0–100.0)
MPV: 9.7 fL (ref 9.1–12.4)
NRBC %: 0 % (ref 0.0–0.0)
NRBC Absolute: 0 10*3/uL (ref 0.00–2.00)
Platelets: 253 10*3/uL (ref 150–400)
RBC: 4.34 M/uL (ref 4.20–5.90)
RDW-CV: 11.5 % (ref 11.5–14.5)
RDW-SD: 37.4 fL (ref 35.0–51.0)
WBC: 6.7 10*3/uL (ref 4.0–11.0)

## 2022-04-20 LAB — COMPREHENSIVE METABOLIC PANEL
ALT: 22 U/L (ref 0–55)
AST: 13 U/L (ref 6–42)
Albumin: 3.3 g/dL (ref 3.2–5.0)
Alkaline phosphatase: 49 U/L (ref 30–130)
Anion Gap: 6 mmol/L (ref 3–14)
BUN: 10 mg/dL (ref 6–24)
Bilirubin, total: 0.5 mg/dL (ref 0.2–1.2)
CO2 (Bicarbonate): 26 mmol/L (ref 20–32)
Calcium: 8.2 mg/dL — ABNORMAL LOW (ref 8.5–10.5)
Chloride: 100 mmol/L (ref 98–110)
Creatinine: 0.73 mg/dL (ref 0.55–1.30)
Glucose: 105 mg/dL (ref 70–110)
Potassium: 3.6 mmol/L (ref 3.6–5.2)
Protein, total: 6.9 g/dL (ref 6.0–8.4)
Sodium: 132 mmol/L — ABNORMAL LOW (ref 135–146)
eGFRcr: 93 mL/min/{1.73_m2} (ref >=60)

## 2022-04-20 LAB — URINE CULTURE: Urine Culture: >100000

## 2022-04-20 MED FILL — PRAVASTATIN 20 MG TABLET: 20 20 mg | ORAL | Qty: 2

## 2022-04-20 MED FILL — ASPIRIN 81 MG TABLET,DELAYED RELEASE: 81 81 mg | ORAL | Qty: 1

## 2022-04-20 MED FILL — AMLODIPINE 5 MG TABLET: 5 5 mg | ORAL | Qty: 2

## 2022-04-20 NOTE — Discharge Instructions (Signed)
Follow-up with primary care physician in 8 days    Check CBC and BMP on an outpatient basis with primary care physician's office    COVID isolation as per protocol.

## 2022-04-20 NOTE — Discharge Summary (Signed)
Discharge Diagnosis  Hyponatremia improved resolved  Weakness improved  COVID-19      Hospital Course   81 year old male admitted here to the hospital with  Hyponatremia  1) Weakness 2/2 to hyponatremia  -Much improved able to ambulate well no indication for rehab.    2) HTN    - Pt to continue on amlodipine    3) Recent COVID  - Pt tested (+) on 9/21 --> past 5 days since (+) and feeling better  Remains on COVID precautions.  Monitor    4) HLD  - Pt to continue on statin    DVT: SCDs  CODE: Full  A.m. labs noted normal sodium no further inpatient intervention discharge plan Home today.    Test Results Pending At Discharge  Pending Labs     Order Current Status    Urine culture In process        Pertinent Physical Exam At Time of Discharge  Physical Exam  Visit Vitals  BP 121/65 (BP Location: Left arm, Patient Position: Lying)   Pulse 76   Temp 37 C (98.6 F) (Oral)   Resp 17   Ht 1.6 m   Wt 78.1 kg   SpO2 98%   BMI 30.50 kg/m   BSA 1.86 m     General: Alert and oriented x 3   Eye: Pupils are equal, No JVD  Respiratory: Respirations are non labored, Lungs are clear to auscultation  Cardiovascular: Normal rate, Regular rhythm, s1 s2 appreciated, no edema  Gastrointestinal: Soft, Non-tender, Non-distended.BS +  Genitourinary: No costovertebral angle tenderness  Musculoskeletal Normal range of motion  Integumentary : no skin lesions  Neurologic: Alert, Oriented x 3,  normal  Exam  Results from last 7 days   Lab Units 04/20/22  0847   SODIUM mmol/L 132*   POTASSIUM mmol/L 3.6   CHLORIDE mmol/L 100   CO2 mmol/L 26   BUN mg/dL 10   CREATININE mg/dL 1.61   GLUCOSE mg/dL 096   CALCIUM mg/dL 8.2*     Results from last 7 days   Lab Units 04/20/22  0847   WBC AUTO K/uL 6.7   HEMOGLOBIN g/dL 04.5   HEMATOCRIT % 40.9   PLATELETS AUTO K/uL 253        Your medication list      CONTINUE taking these medications      Instructions Last Dose Given Next Dose Due   amLODIPine 10 mg tablet  Commonly known as: Norvasc            ascorbic acid 1,000 mg tablet  Commonly known as: Vitamin C           aspirin 81 mg EC tablet           cyanocobalamin 1,000 mcg tablet  Commonly known as: Vitamin B-12           omega-3 fatty acids-fish oil 340-1,000 mg capsule           PARoxetine 10 mg tablet  Commonly known as: Paxil           simvastatin 10 mg tablet  Commonly known as: Zocor              STOP taking these medications    losartan 100 mg tablet  Commonly known as: Cozaar        ondansetron 4 mg tablet  Commonly known as: Zofran               Issues Requiring Follow-Up  Follow-up with primary care physician in 8 days    Check CBC and BMP on an outpatient basis with primary care physician's office    COVID isolation as per protocol.    FACE TO FACE PATIENT COUNSELLING COORDINATING CARE MORE THAN 50% OF ENCOUNTER TIME: YES    TOTAL ENCOUNTER TIME: >30 MINS     Disclaimer: This note was generated with Dragon Medical voice recognition program.  Please excuse any errors which may have been overlooked during review.  Sometimes, these errors may affect the content or meaning of a given sentence.    Loletta Specter, MD  Hospitalist  [facility] Inpatient Specialist

## 2022-04-20 NOTE — Other (Signed)
Patient Education   Table of Contents     COVID-19     Hyponatremia     To view videos and all your education online visit,   [email or web address]   or scan this QR code with your smartphone.   Access to this content will expire in one year.                    COVID-19     COVID-19, or coronavirus disease 2019, is an infection that is caused by a new (novel) coronavirus called SARS-CoV-2. COVID-19 can cause many symptoms. In some people, the virus may not cause any symptoms. In others, it may cause mild or severe symptoms. Some people with severe infection develop severe disease.   What are the causes?       This illness is caused by a virus. The virus may be in the air as tiny specks of fluid (aerosols) or droplets, or it may be on surfaces. You may catch the virus by:     Breathing in droplets from an infected person. Droplets can be spread by a person breathing, speaking, singing, coughing, or sneezing.     Touching something, like a table or a doorknob, that has virus on it (is contaminated) and then touching your mouth, nose, or eyes.       What increases the risk?   Risk for infection:  You are more likely to get infected with the COVID-19 virus if:     You are within 6 ft (1.8 m) of a person with COVID-19 for 15 minutes or longer.     You are providing care for a person who is infected with COVID-19.     You are in close personal contact with other people. Close personal contact includes hugging, kissing, or sharing eating or drinking utensils.     Risk for serious illness caused by COVID-19:  You are more likely to get seriously ill from the COVID-19 virus if:     You have cancer.     You have a long-term (chronic) disease, such as:     Chronic lung disease. This includes pulmonary embolism, chronic obstructive pulmonary disease, and cystic fibrosis.     Long-term disease that lowers your body's ability to fight infection (immunocompromise).     Serious cardiac conditions, such as heart failure,  coronary artery disease, or cardiomyopathy.     Diabetes.     Chronic kidney disease.     Liver diseases. These include cirrhosis, nonalcoholic fatty liver disease, alcoholic liver disease, or autoimmune hepatitis.     You have obesity.     You are pregnant or were recently pregnant.     You have sickle cell disease.     What are the signs or symptoms?    Symptoms of this condition can range from mild to severe. Symptoms may appear any time from 2 to 14 days after being exposed to the virus. They include:     Fever or chills.     Shortness of breath or trouble breathing.     Feeling tired or very tired.     Headaches, body aches, or muscle aches.     Runny or stuffy nose, sneezing, coughing, or sore throat.     New loss of taste or smell. This is rare.     Some people may also have stomach problems, such as nausea, vomiting, or diarrhea.   Other people may not have any symptoms of COVID-19.  How is this diagnosed?       This condition may be diagnosed by testing samples to check for the COVID-19 virus. The most common tests are the PCR test and the antigen test. Tests may be done in the lab or at home. They include:     Using a swab to take a sample of fluid from the back of your nose and throat (nasopharyngeal fluid), from your nose, or from your throat.     Testing a sample of saliva from your mouth.     Testing a sample of coughed-up mucus from your lungs (sputum).       How is this treated?    Treatment for COVID-19 infection depends on the severity of the condition.     Mild symptoms can be managed at home with rest, fluids, and over-the-counter medicines.     Serious symptoms may be treated in a hospital intensive care unit (ICU). Treatment in the ICU may include:     Supplemental oxygen. Extra oxygen is given through a tube in the nose, a face mask, or a hood.     Medicines. These may include:     Antivirals, such as monoclonal antibodies. These help your body fight off certain viruses that can cause  disease.     Anti-inflammatories, such as corticosteroids. These reduce inflammation and suppress the immune system.     Antithrombotics. These prevent or treat blood clots, if they develop.     Convalescent plasma. This helps boost your immune system, if you have an underlying immunosuppressive condition or are getting immunosuppressive treatments.         Prone positioning. This means you will lie on your stomach. This helps oxygen to get into your lungs.     Infection control measures.     If you are at risk for more serious illness caused by COVID-19, your health care provider may prescribe two long-acting monoclonal antibodies, given together every 6 months.   How is this prevented?   To protect yourself:     Use preventive medicine (pre-exposure prophylaxis). You may get pre-exposure prophylaxis if you have moderate or severe immunocompromise.     Get vaccinated. Anyone 33 months old or older who meets guidelines can get a COVID-19 vaccine or vaccine series. This includes people who are pregnant or making breast milk (lactating).     Get an added dose of COVID-19 vaccine after your first vaccine or vaccine series if you have moderate to severe immunocompromise. This applies if you have had a solid organ transplant or have been diagnosed with an immunocompromising condition.     You should get the added dose 4 weeks after you got the first COVID-19 vaccine or vaccine series.     If you get an mRNA vaccine, you will need a 3-dose primary series.     If you get the J&J/Janssen vaccine, you will need a 2-dose primary series, with the second dose being an mRNA vaccine.     Talk to your health care provider about getting experimental monoclonal antibodies. This treatment is approved under emergency use authorization to prevent severe illness before or after being exposed to the COVID-19 virus. You may be given monoclonal antibodies if:     You have moderate or severe immunocompromise. This includes treatments that  lower your immune response. People with immunocompromise may not develop protection against COVID-19 when they are vaccinated.     You cannot be vaccinated. You may not get a vaccine if  you have a severe allergic reaction to the vaccine or its components.     You are not fully vaccinated.     You are in a facility where COVID-19 is present and:     Are in close contact with a person who is infected with the COVID-19 virus.     Are at high risk of being exposed to the COVID-19 virus.     You are at risk of illness from new variants of the COVID-19 virus.     To protect others:  If you have symptoms of COVID-19, take steps to prevent the virus from spreading to others.     Stay home. Leave your house only to get medical care. Do not  use public transit, if possible.    Do not  travel while you are sick.     Wash your hands often with soap and water for at least 20 seconds. If soap and water are not available, use alcohol-based hand sanitizer.     Make sure that all people in your household wash their hands well and often.     Cough or sneeze into a tissue or your sleeve or elbow. Do not  cough or sneeze into your hand or into the air.     Where to find more information       Centers for Disease Control and Prevention: https://www.clark-whitaker.org/       World Health Organization: https://thompson-craig.com/       Get help right away if:       You have trouble breathing.     You have pain or pressure in your chest.     You are confused.     You have bluish lips and fingernails.     You have trouble waking from sleep.     You have symptoms that get worse.     These symptoms may be an emergency. Get help right away. Call 911.    Do not wait to see if the symptoms will go away.    Do not drive yourself to the hospital.     Summary       COVID-19 is an infection that is caused by a new coronavirus.     Sometimes, there are no symptoms. Other times, symptoms range from mild to severe. Some people with a severe COVID-19  infection develop severe disease.     The virus that causes COVID-19 can spread from person to person through droplets or aerosols from breathing, speaking, singing, coughing, or sneezing.     Mild symptoms of COVID-19 can be managed at home with rest, fluids, and over-the-counter medicines.     This information is not intended to replace advice given to you by your health care provider. Make sure you discuss any questions you have with your health care provider.     Document Released: 07/13/2018 Document Revised: 05/26/2021 Document Reviewed: 05/28/2021     Elsevier Patient Education ? 2023 [employer]         Hyponatremia     Hyponatremia is when the amount of salt (sodium) in a person's blood is too low. When sodium levels are low, the cells absorb extra water, which causes them to swell. The swelling happens throughout the body, but it mostly affects the brain.   What are the causes?    This condition may be caused by:     Certain medical conditions, such as:     Heart, kidney, or liver problems.  Thyroid problems.     Adrenal gland problems.     Metabolic conditions, such as Addison's disease or syndrome of inappropriate antidiuresis (SIAD).     Excessive vomiting, diarrhea, or sweating.     Certain medicines or illegal drugs.     Fluids given through an IV.     What increases the risk?    You are more likely to develop this condition if you:     Have certain medical conditions such as heart, kidney, or liver failure.     Have a medical condition that causes frequent or excessive diarrhea.     Participate in intense physical activities, such as marathon running.     Take certain medicines that affect the sodium and fluid balance in the blood. Some of these medicine types include:     Diuretics.     NSAIDs, such as ibuprofen.     Some opioid pain medicines.     Some antidepressants.     Some seizure prevention medicines.     What are the signs or symptoms?    Symptoms of this condition include:      Headache.     Nausea and vomiting.     Being very tired (lethargic).     Muscle weakness and cramping.     Loss of appetite.     Feeling weak or light-headed.      Severe symptoms of this condition include:     Confusion.     Agitation.     Having a rapid heart rate.     Fainting.     Seizures.     Coma.     How is this diagnosed?    This condition is diagnosed based on:     A physical exam.     Your medical history.     Tests, including:     Blood tests.     Urine tests.     How is this treated?       Treatment for this condition depends on the cause. Treatment may include:     Getting fluids through an IV that is inserted into one of your veins.     Medicines to correct the sodium imbalance. If medicines are causing the condition, the medicines will need to be adjusted.     Limiting your water or fluid intake to get the correct sodium balance, in certain cases.     Monitoring in the hospital to closely watch your symptoms for improvement.       Follow these instructions at home:          Take over-the-counter and prescription medicines only as told by your health care provider. Many medicines can make this condition worse. Talk with your health care provider about any medicines that you are currently taking.    Do not  drink alcohol.     Keep all follow-up visits. This is important.       Contact a health care provider if:       You develop worsening nausea, fatigue, headache, confusion, or weakness.     Your symptoms go away and then return.     Get help right away if:       You have a seizure.     You faint.     You have ongoing diarrhea or vomiting.     Summary       Hyponatremia is when the amount of salt (sodium) in your blood is too low.  When sodium levels are low, your cells absorb extra water, which causes them to swell.     The swelling happens throughout the body, but it mostly affects the brain.     Treatment for this condition depends on the cause. It may include receiving IV fluids, taking or  adjusting medicines, limiting fluid intake, and monitoring in the hospital.     This information is not intended to replace advice given to you by your health care provider. Make sure you discuss any questions you have with your health care provider.     Document Released: 05/28/2002 Document Revised: 12/16/2020 Document Reviewed: 12/16/2020     Elsevier Patient Education ? 2023 [employer] Inc[employer]

## 2022-04-20 NOTE — Progress Notes (Signed)
04/20/22 1128   Case Management Initial Assessment   Patient Discharged Before Able to Interview/Assess No   Source of Information Patient   Type of Residence Home   Lives With Spouse/Significant Other   Discharge Services Anticipated at this Time No   Expected Discharge Needs None   Patient Information   Interpreter Needs None   Home Caregiver Self   Accompanied by None   Support Systems Spouse/Significant Other   Need PCP on Discharge Yes   Current Patient Responsibilities Laundry;Driving;Meal Prep;Shopping;Housekeeping;Personal Care   PTA Functional Status   Stairs to Enter House 5   Stairs Inside Home 0   Is patient prescribed an anti-coagulant? No   Current Functional Status   Bathing Independent   Toileting Independent   Walking in Home Independent   Assistive Device Used None   Community Mobility Independent   IADL Assistance Needed No   04/20/22 Case Management Progress Note:Patient spoke with patient over phone for initial assessment. Patient reports being IPTA.  Carlos Moody is a 81 y.o. male with PMHX significant for HTN, HLD who comes in presenting with weakness and some dizziness.   Pt stated he tested positive for COVID on October 21st.  Since testing positive, pt had nausea, vomiting and cough.  Pt denies much chest pain. Pt stated that last night his dizziness got worse and came to the ED for further evaluation.    Patient lives with his wife . He has been medically cleared for discharge . He reports needing no services at home.  Jacques Earthly, RN  11:36 AM

## 2022-04-20 NOTE — Care Plan (Signed)
Goal Outcome Evaluation:  Plan of Care Reviewed With: patient  Progress: improving  Outcome Evaluation: See flowsheet for assessment. Remains on enhanced precautions. Sodium improved to 132 today, plan to d/c home.

## 2023-07-08 IMAGING — MR MRA BRAIN WITHOUT CONTRAST
4 series · 8 of 48 positions shown · non-contrast
Comparison: None.

________________________________________________________________________________________________ 
MRA BRAIN WITHOUT CONTRAST, 07/08/2023 [DATE]: 
CLINICAL INDICATION: Family History Of Ischemic Heart Disease And Other Diseases 
Of The Circulatory System
TECHNIQUE: MR arteriography with MIPs of the brain is performed with computer 
reformatting of the source data to create arteriographic images. Patient was 
scanned on a 1.5T magnet.

[Series 102: mpr - smartbrain · axial · 1.1mm · 1.09mm/px · 1 of 2 slices shown]
[im 1/2]
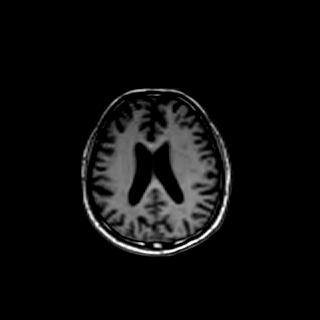

[Series 301: 3d_tof_cow · axial · 1.0mm · 0.38mm/px · z∈[-70,-0]mm · 3 of 200 slices shown]
[im 29/200]
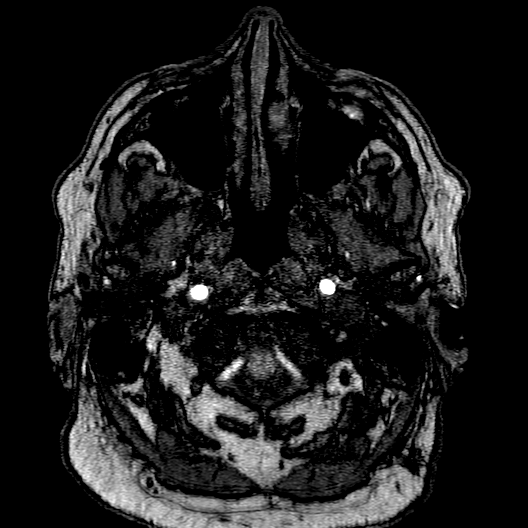
[im 100/200]
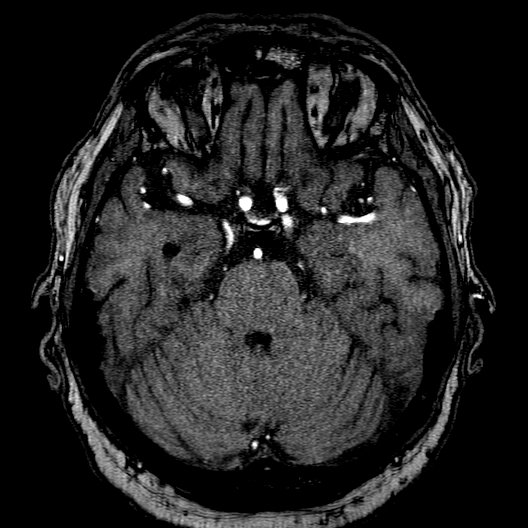
[im 171/200]
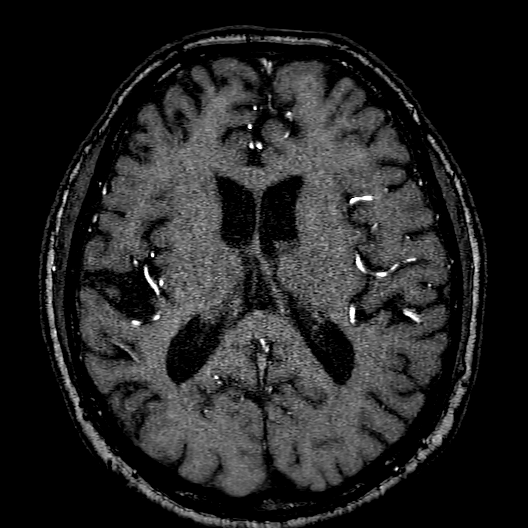

[Series 303: cor mpr · coronal · 2.0mm · 0.20mm/px · 3 of 64 slices shown]
[im 8/64]
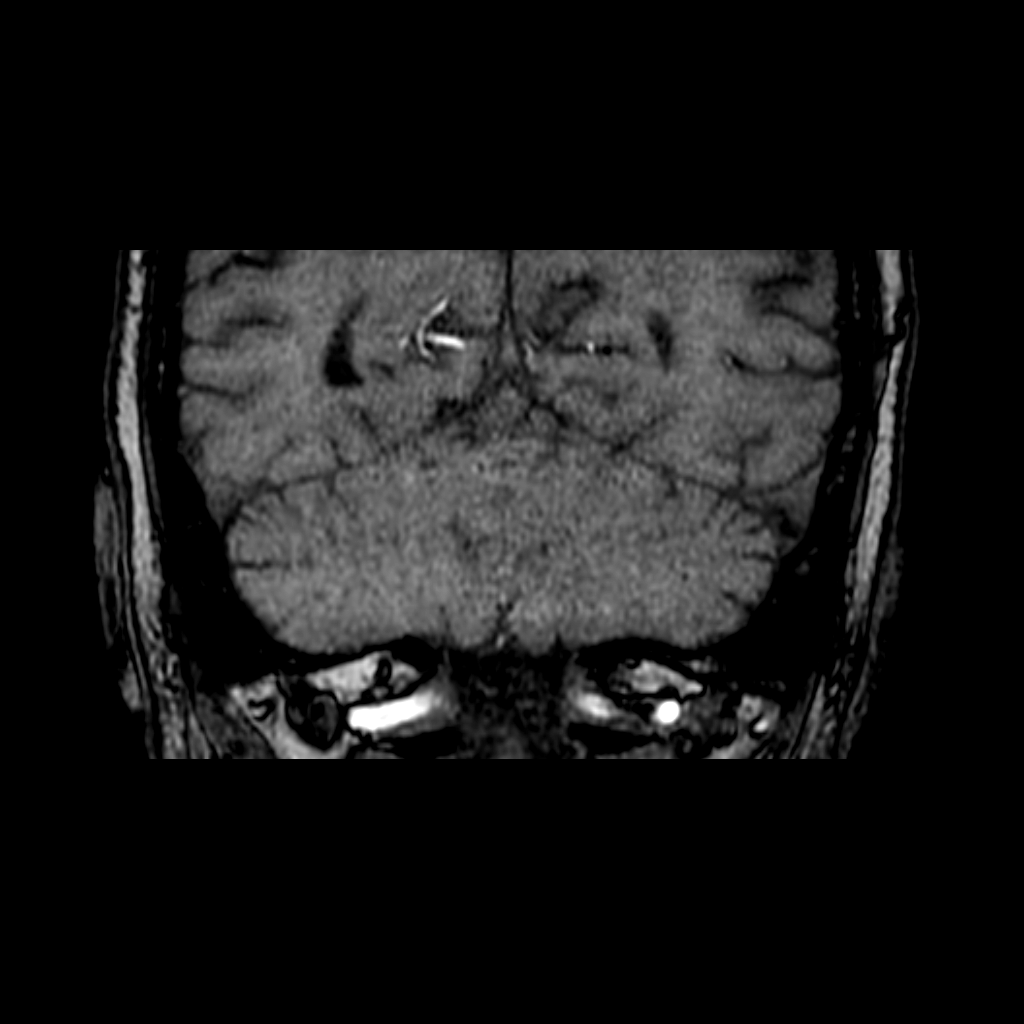
[im 32/64]
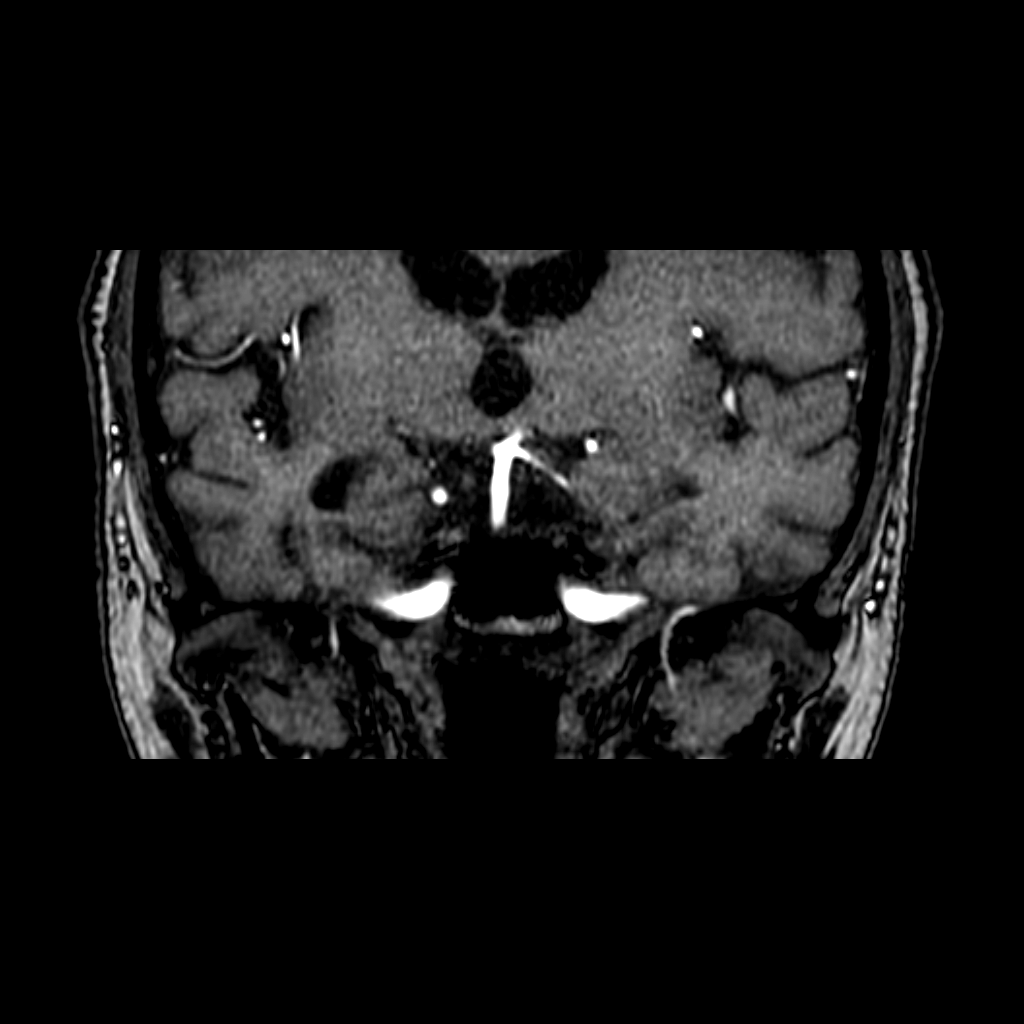
[im 56/64]
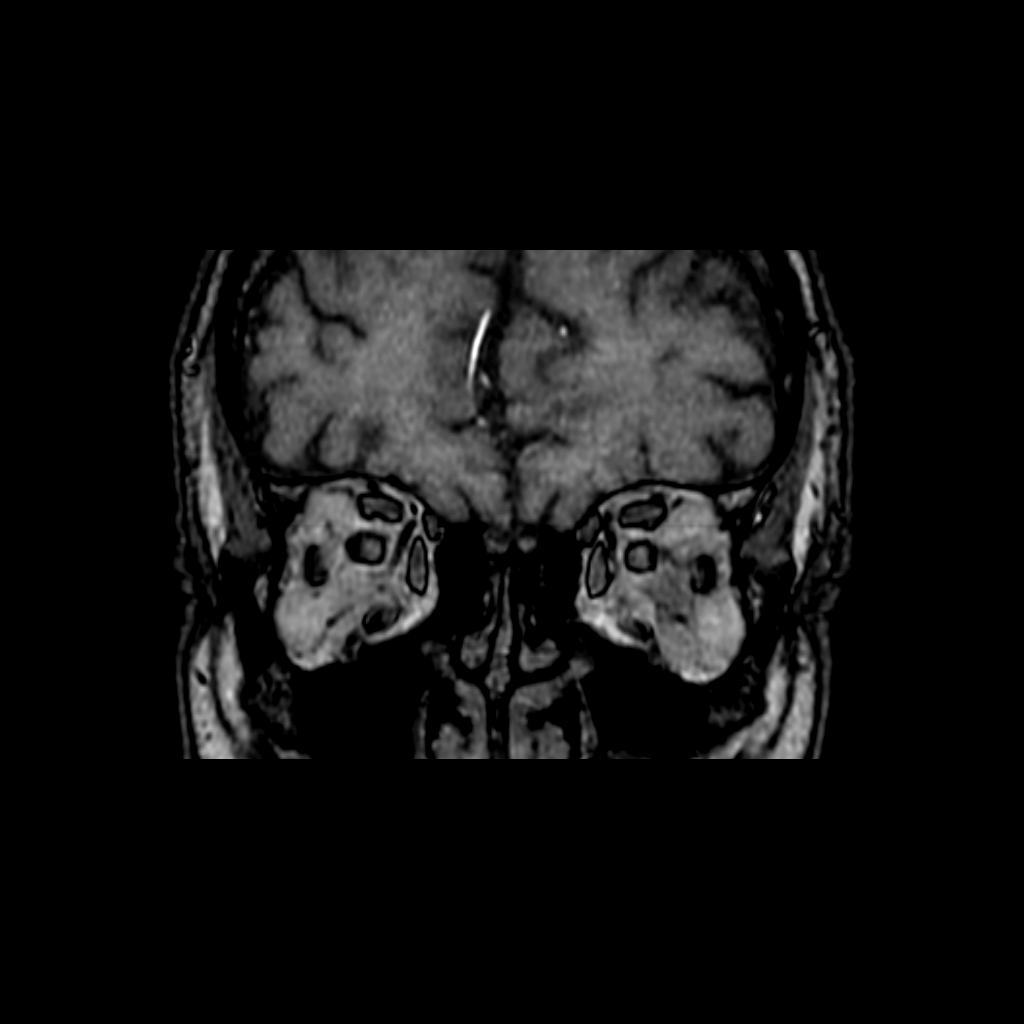

[Series 304: sag mpr · sagittal · 2.0mm · 0.20mm/px · 1 of 65 slices shown]
[im 9/65]
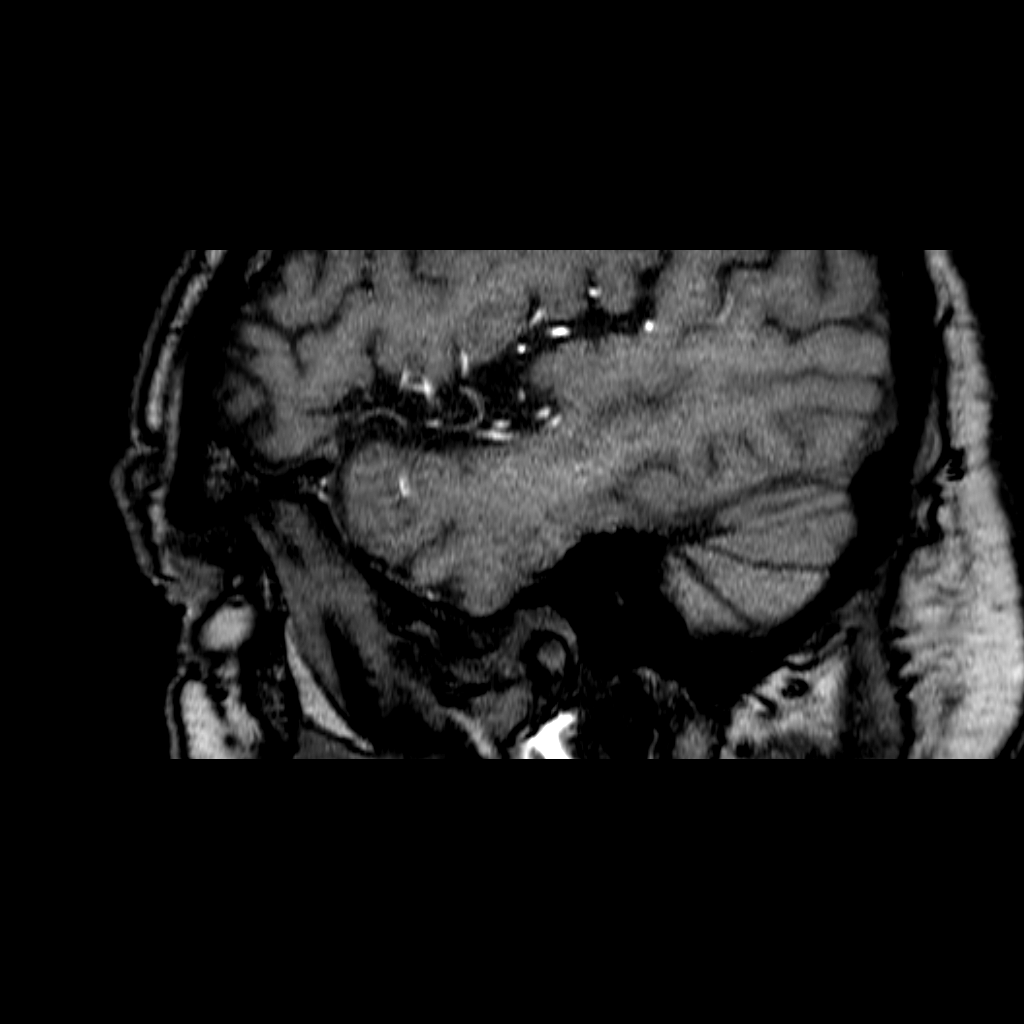

[8 of 48 positions shown; findings below may reference images not displayed]

FINDINGS: RIGHT ANTERIOR CIRCULATION: Distal internal carotid artery, anterior and middle 
cerebral arteries are patent without significant stenosis. Fetal origin right 
posterior cerebral artery. 

LEFT ANTERIOR CIRCULATION: Distal internal carotid artery, anterior and middle 
cerebral arteries are patent without significant stenosis. 

POSTERIOR CIRCULATION: Distal vertebral arteries, basilar artery, and proximal 
posterior cerebral arteries are patent without significant stenosis. Fetal 
origin right posterior cerebral artery. 

ANEURYSM/AVM: There is focal prominence of the right side of the anterior 
communicating artery which is likely fenestration as opposed to aneurysm.  
OTHER: No gross intracranial abnormality, mild mucosal change in the left 
maxillary sinus.
IMPRESSION: 1.  Patency of intracranial vasculature. 
2.  Fenestration versus aneurysm along the right side of the anterior 
communicating artery, favor fenestration. Recommend further evaluation with CT 
angiogram head.

## 2023-09-01 IMAGING — CT CTA BRAIN
3 of 11 series · 17 of 47 positions shown · IV contrast (APPLIED)
Comparison: MR angiogram head from July 08, 2023.

________________________________________________________________________________________________ 
CTA BRAIN, 09/01/2023 [DATE]: 
CLINICAL INDICATION: History of fenestration versus aneurysm along the right 
side of the anterior communicating artery 
A search for DICOM formatted images was conducted for prior CT imaging studies 
completed at a non-affiliated media free facility.
TECHNIQUE: The head was scanned from vertex through skull base with contrast on 
a high resolution low dose CT scanner.  100 mL Isovue 370 MDV of were injected 
intravenously. Routine MPR and MIP 3D renderings were reconstructed on an 
independent workstation with concurrent physician supervision. The patients 
eGFR was calculated to be 71.9 mL/min/1.73 m2 using the i-STAT device.

[Series 11: vitrea · axial · 0.38mm/px · z∈[-111,+13]mm · 11 of 369 slices shown]
[im 29/369  brain]
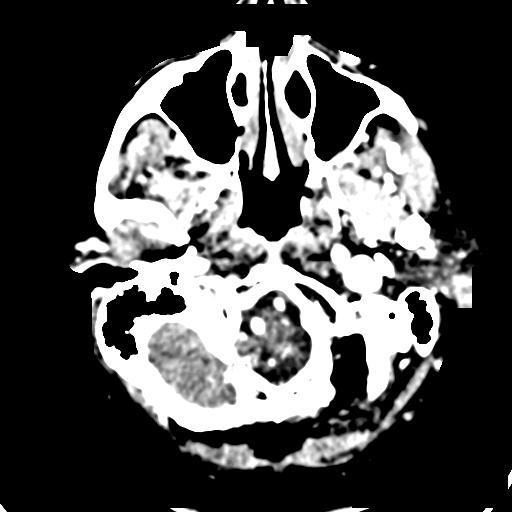
[im 57/369  bone]
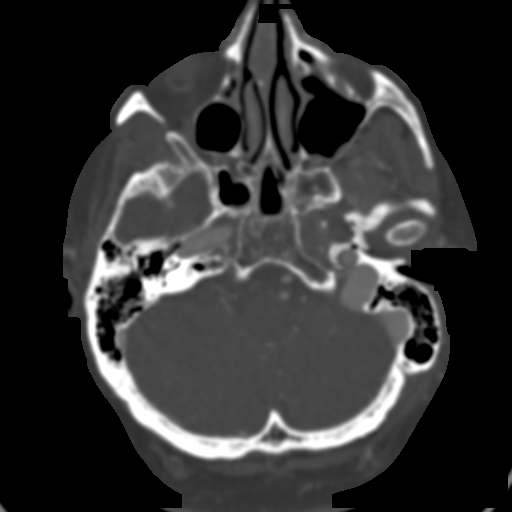
[im 85/369  brain]
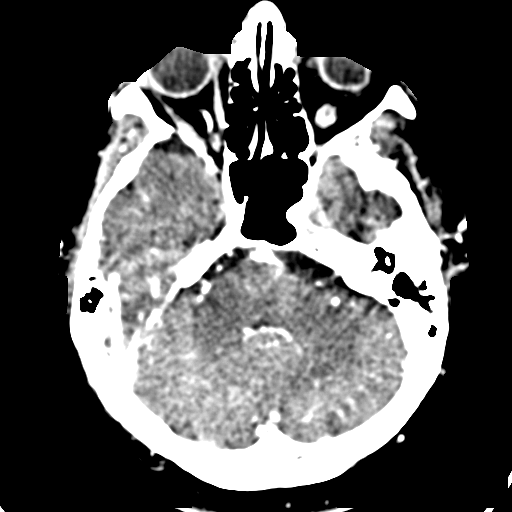
[im 114/369  bone]
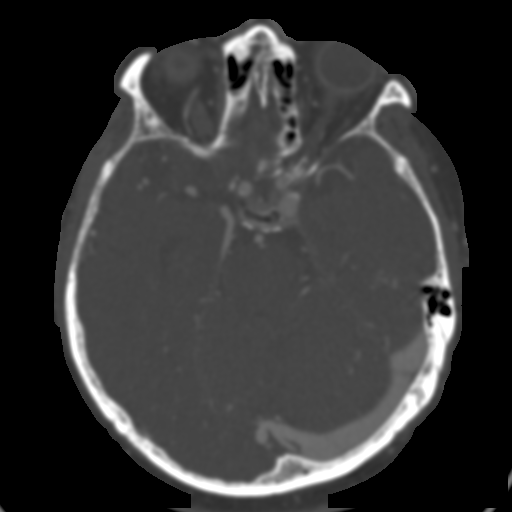
[im 142/369  brain]
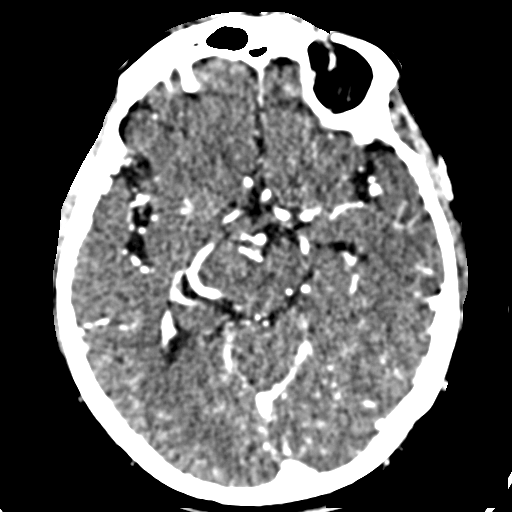
[im 199/369  bone]
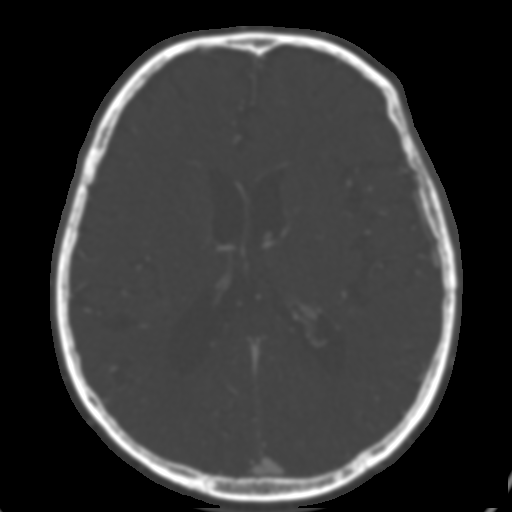
[im 227/369  brain]
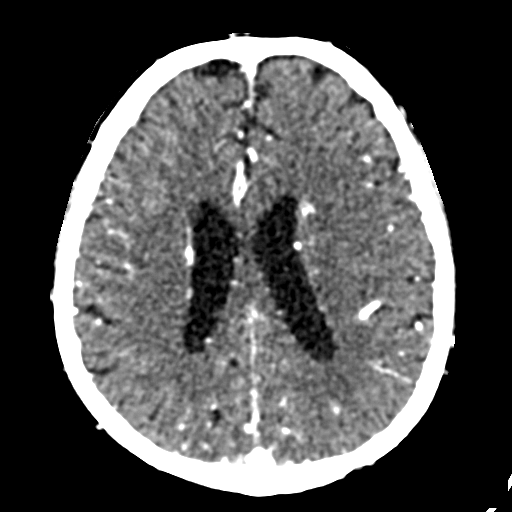
[im 255/369  bone]
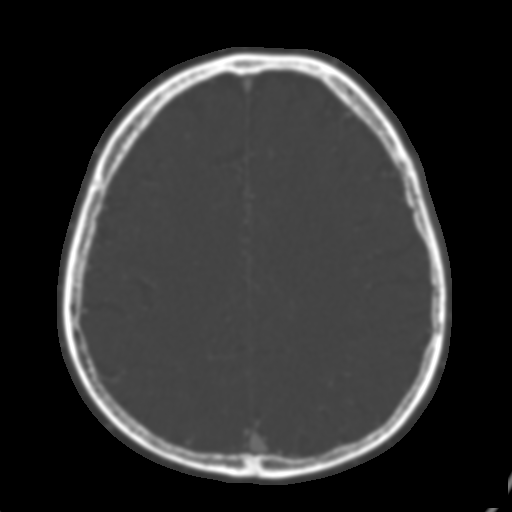
[im 284/369  brain]
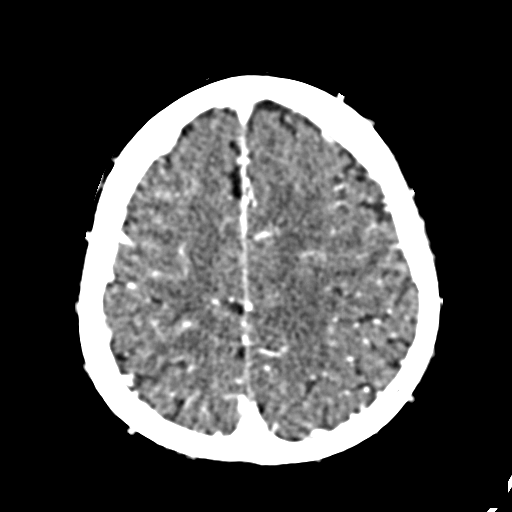
[im 312/369  bone]
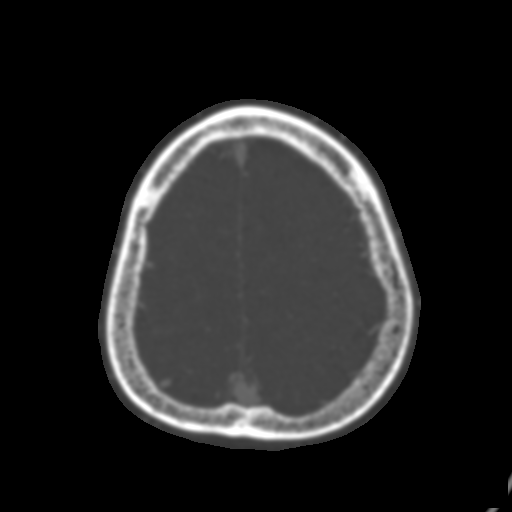
[im 340/369  brain]
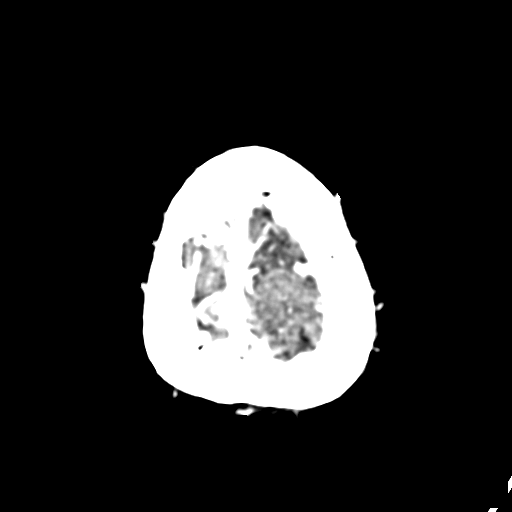

[Series 12: coronal · coronal · 0.31mm/px · 3 of 292 slices shown]
[im 59/292  brain]
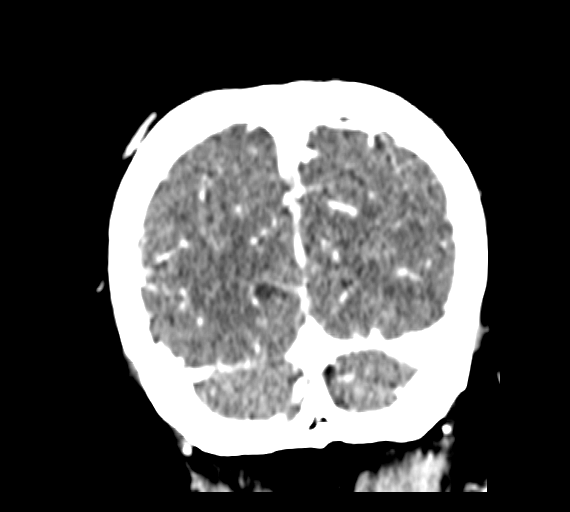
[im 117/292  brain]
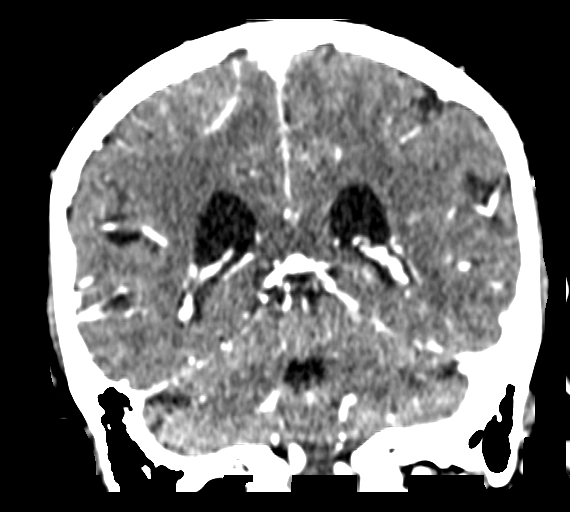
[im 175/292  brain]
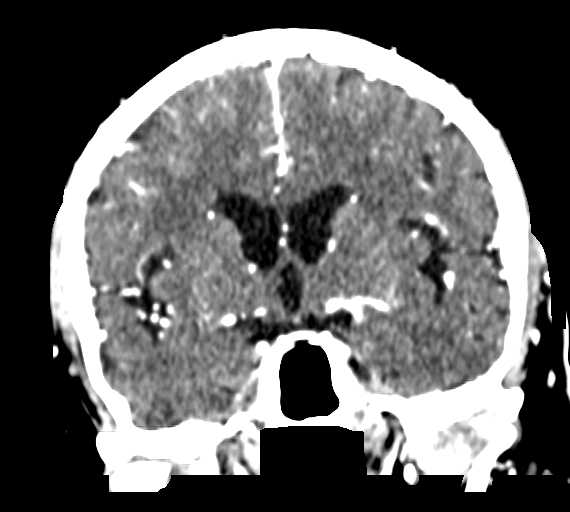

[Series 13: sag · sagittal · 0.34mm/px · 3 of 293 slices shown]
[im 74/293  brain]
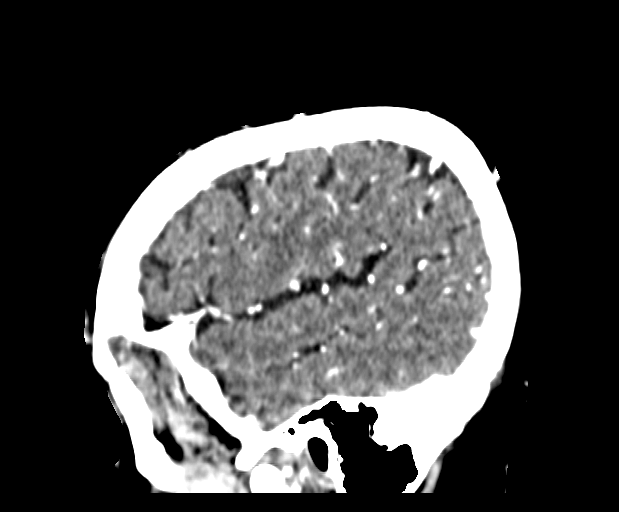
[im 147/293  brain]
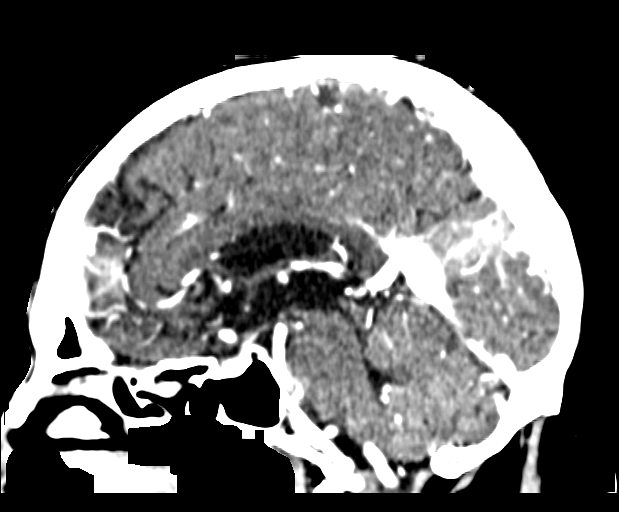
[im 220/293  brain]
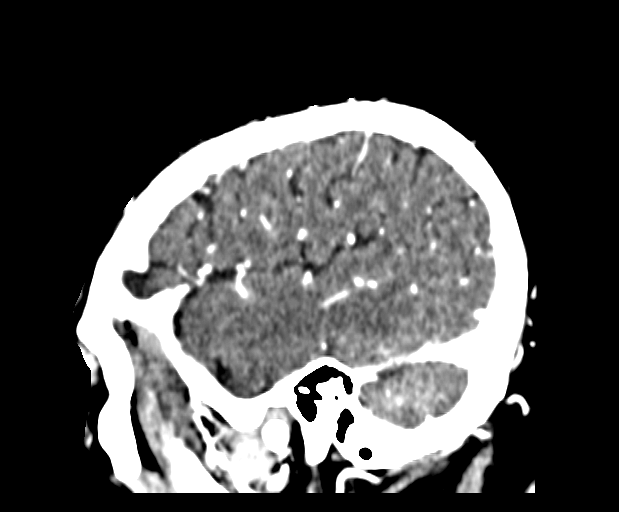

[17 of 47 positions shown; findings below may reference images not displayed]

Count of known CT and Cardiac Nuclear Medicine studies performed in the previous 
12 months = 0.
FINDINGS: --------------------------------------------------------------------------- 
Angiography: 
RIGHT ANTERIOR: Tiny fenestration along the right side of the anterior 
communicating artery without aneurysm. Patency of the right anterior arterial 
circulation. 
LEFT ANTERIOR: Patency of the left anterior arterial circulation. 
POSTERIOR: Fetal origin right posterior cerebral artery. Patency of the 
posterior arterial circulation. 
--------------------------------------------------------------------------- 
Nonvascular: 
No acute intracranial abnormality, mass, pathologic enhancement. 
Mild mucosal thickening in the left maxillary sinus. Mastoid air cells and 
middle ear cavities are clear. 
---------------------------------------------------------------------------
IMPRESSION: 1.  Patency of intracranial vasculature without evidence of aneurysm or vascular 
malformation. 
2.  Tiny fenestration along the right side of the anterior communicating artery 
of no clinical significance. 
RADIATION DOSE REDUCTION: All CT scans are performed using radiation dose 
reduction techniques, when applicable.  Technical factors are evaluated and 
adjusted to ensure appropriate moderation of exposure.  Automated dose 
management technology is applied to adjust the radiation doses to minimize 
exposure while achieving diagnostic quality images.
